# Patient Record
Sex: Female | Born: 1945 | Race: Black or African American | Hispanic: No | Marital: Married | Smoking: Former smoker
Health system: Southern US, Community
[De-identification: ages and names within clinical notes are randomized; demographics above are authoritative.]

## PROBLEM LIST (undated history)

## (undated) DIAGNOSIS — E7800 Pure hypercholesterolemia, unspecified: Secondary | ICD-10-CM

## (undated) DIAGNOSIS — R627 Adult failure to thrive: Secondary | ICD-10-CM

## (undated) DIAGNOSIS — K760 Fatty (change of) liver, not elsewhere classified: Secondary | ICD-10-CM

## (undated) DIAGNOSIS — K7010 Alcoholic hepatitis without ascites: Secondary | ICD-10-CM

## (undated) DIAGNOSIS — Z720 Tobacco use: Secondary | ICD-10-CM

## (undated) DIAGNOSIS — IMO0001 Reserved for inherently not codable concepts without codable children: Secondary | ICD-10-CM

## (undated) DIAGNOSIS — D649 Anemia, unspecified: Secondary | ICD-10-CM

## (undated) DIAGNOSIS — K8020 Calculus of gallbladder without cholecystitis without obstruction: Secondary | ICD-10-CM

## (undated) DIAGNOSIS — F1020 Alcohol dependence, uncomplicated: Secondary | ICD-10-CM

## (undated) DIAGNOSIS — G312 Degeneration of nervous system due to alcohol: Secondary | ICD-10-CM

## (undated) DIAGNOSIS — N390 Urinary tract infection, site not specified: Secondary | ICD-10-CM

## (undated) DIAGNOSIS — F1027 Alcohol dependence with alcohol-induced persisting dementia: Secondary | ICD-10-CM

## (undated) DIAGNOSIS — I10 Essential (primary) hypertension: Secondary | ICD-10-CM

---

## 2012-11-16 ENCOUNTER — Emergency Department (HOSPITAL_COMMUNITY): Payer: Self-pay

## 2012-11-16 ENCOUNTER — Inpatient Hospital Stay (HOSPITAL_COMMUNITY)
Admission: EM | Admit: 2012-11-16 | Discharge: 2012-11-17 | DRG: 896 | Disposition: A | Discharge status: Home / Self Care | Payer: MEDICAID | Attending: Internal Medicine | Admitting: Internal Medicine

## 2012-11-16 ENCOUNTER — Encounter (HOSPITAL_COMMUNITY): Payer: Self-pay

## 2012-11-16 DIAGNOSIS — K760 Fatty (change of) liver, not elsewhere classified: Secondary | ICD-10-CM | POA: Diagnosis present

## 2012-11-16 DIAGNOSIS — K8020 Calculus of gallbladder without cholecystitis without obstruction: Secondary | ICD-10-CM | POA: Diagnosis present

## 2012-11-16 DIAGNOSIS — D6959 Other secondary thrombocytopenia: Secondary | ICD-10-CM | POA: Diagnosis present

## 2012-11-16 DIAGNOSIS — N390 Urinary tract infection, site not specified: Secondary | ICD-10-CM | POA: Diagnosis present

## 2012-11-16 DIAGNOSIS — G312 Degeneration of nervous system due to alcohol: Secondary | ICD-10-CM

## 2012-11-16 DIAGNOSIS — F17200 Nicotine dependence, unspecified, uncomplicated: Secondary | ICD-10-CM | POA: Diagnosis present

## 2012-11-16 DIAGNOSIS — K7010 Alcoholic hepatitis without ascites: Secondary | ICD-10-CM | POA: Diagnosis present

## 2012-11-16 DIAGNOSIS — R5381 Other malaise: Secondary | ICD-10-CM | POA: Diagnosis present

## 2012-11-16 DIAGNOSIS — D649 Anemia, unspecified: Secondary | ICD-10-CM | POA: Diagnosis present

## 2012-11-16 DIAGNOSIS — F1020 Alcohol dependence, uncomplicated: Secondary | ICD-10-CM | POA: Diagnosis present

## 2012-11-16 DIAGNOSIS — R630 Anorexia: Secondary | ICD-10-CM | POA: Diagnosis present

## 2012-11-16 DIAGNOSIS — E43 Unspecified severe protein-calorie malnutrition: Secondary | ICD-10-CM | POA: Diagnosis present

## 2012-11-16 DIAGNOSIS — D696 Thrombocytopenia, unspecified: Secondary | ICD-10-CM | POA: Diagnosis present

## 2012-11-16 DIAGNOSIS — E871 Hypo-osmolality and hyponatremia: Secondary | ICD-10-CM | POA: Diagnosis present

## 2012-11-16 DIAGNOSIS — R634 Abnormal weight loss: Secondary | ICD-10-CM | POA: Diagnosis present

## 2012-11-16 DIAGNOSIS — IMO0002 Reserved for concepts with insufficient information to code with codable children: Secondary | ICD-10-CM

## 2012-11-16 DIAGNOSIS — R627 Adult failure to thrive: Secondary | ICD-10-CM | POA: Diagnosis present

## 2012-11-16 DIAGNOSIS — IMO0001 Reserved for inherently not codable concepts without codable children: Secondary | ICD-10-CM | POA: Diagnosis present

## 2012-11-16 DIAGNOSIS — F1027 Alcohol dependence with alcohol-induced persisting dementia: Principal | ICD-10-CM

## 2012-11-16 DIAGNOSIS — D72819 Decreased white blood cell count, unspecified: Secondary | ICD-10-CM | POA: Diagnosis not present

## 2012-11-16 HISTORY — DX: Calculus of gallbladder without cholecystitis without obstruction: K80.20

## 2012-11-16 HISTORY — DX: Alcohol dependence with alcohol-induced persisting dementia: F10.27

## 2012-11-16 HISTORY — DX: Alcohol dependence, uncomplicated: F10.20

## 2012-11-16 HISTORY — DX: Tobacco use: Z72.0

## 2012-11-16 HISTORY — DX: Degeneration of nervous system due to alcohol: G31.2

## 2012-11-16 HISTORY — DX: Reserved for inherently not codable concepts without codable children: IMO0001

## 2012-11-16 HISTORY — DX: Alcoholic hepatitis without ascites: K70.10

## 2012-11-16 HISTORY — DX: Fatty (change of) liver, not elsewhere classified: K76.0

## 2012-11-16 HISTORY — DX: Adult failure to thrive: R62.7

## 2012-11-16 HISTORY — DX: Urinary tract infection, site not specified: N39.0

## 2012-11-16 LAB — COMPREHENSIVE METABOLIC PANEL
ALT: 101 U/L — ABNORMAL HIGH (ref 0–35)
AST: 320 U/L — ABNORMAL HIGH (ref 0–37)
Albumin: 2.7 g/dL — ABNORMAL LOW (ref 3.5–5.2)
Alkaline Phosphatase: 168 U/L — ABNORMAL HIGH (ref 39–117)
Chloride: 86 mEq/L — ABNORMAL LOW (ref 96–112)
Potassium: 3.7 mEq/L (ref 3.5–5.1)
Sodium: 126 mEq/L — ABNORMAL LOW (ref 135–145)
Total Bilirubin: 2.5 mg/dL — ABNORMAL HIGH (ref 0.3–1.2)
Total Protein: 7.2 g/dL (ref 6.0–8.3)

## 2012-11-16 LAB — CBC WITH DIFFERENTIAL/PLATELET
Basophils Relative: 1 % (ref 0–1)
Eosinophils Absolute: 0 10*3/uL (ref 0.0–0.7)
Hemoglobin: 11 g/dL — ABNORMAL LOW (ref 12.0–15.0)
MCH: 32.8 pg (ref 26.0–34.0)
MCHC: 36.1 g/dL — ABNORMAL HIGH (ref 30.0–36.0)
Neutro Abs: 2.8 10*3/uL (ref 1.7–7.7)
Neutrophils Relative %: 62 % (ref 43–77)
Platelets: 111 10*3/uL — ABNORMAL LOW (ref 150–400)
RBC: 3.35 MIL/uL — ABNORMAL LOW (ref 3.87–5.11)

## 2012-11-16 LAB — AMMONIA: Ammonia: 37 umol/L (ref 11–60)

## 2012-11-16 MED ORDER — PANTOPRAZOLE SODIUM 40 MG PO TBEC
40.0000 mg | DELAYED_RELEASE_TABLET | Freq: Every day | ORAL | Status: DC
  Administered 2012-11-17: 40 mg via ORAL
  Filled 2012-11-16: qty 1

## 2012-11-16 MED ORDER — FOLIC ACID 1 MG PO TABS
1.0000 mg | ORAL_TABLET | Freq: Every day | ORAL | Status: DC
  Administered 2012-11-16 – 2012-11-17 (×2): 1 mg via ORAL
  Filled 2012-11-16 (×2): qty 1

## 2012-11-16 MED ORDER — DOCUSATE SODIUM 100 MG PO CAPS
100.0000 mg | ORAL_CAPSULE | Freq: Two times a day (BID) | ORAL | Status: DC
  Administered 2012-11-16 – 2012-11-17 (×2): 100 mg via ORAL
  Filled 2012-11-16 (×6): qty 1

## 2012-11-16 MED ORDER — ALUM & MAG HYDROXIDE-SIMETH 200-200-20 MG/5ML PO SUSP: 30.0000 mL | Freq: Four times a day (QID) | ORAL | Status: DC | PRN

## 2012-11-16 MED ORDER — ONDANSETRON HCL 4 MG PO TABS: 4.0000 mg | ORAL_TABLET | Freq: Four times a day (QID) | ORAL | Status: DC | PRN

## 2012-11-16 MED ORDER — THIAMINE HCL 100 MG/ML IJ SOLN
100.0000 mg | Freq: Every day | INTRAMUSCULAR | Status: DC
  Administered 2012-11-16: 100 mg via INTRAVENOUS
  Filled 2012-11-16: qty 2

## 2012-11-16 MED ORDER — ENSURE COMPLETE PO LIQD
237.0000 mL | Freq: Two times a day (BID) | ORAL | Status: DC
  Administered 2012-11-17: 237 mL via ORAL
  Filled 2012-11-16: qty 237

## 2012-11-16 MED ORDER — NICOTINE 21 MG/24HR TD PT24
21.0000 mg | MEDICATED_PATCH | Freq: Every day | TRANSDERMAL | Status: DC
  Administered 2012-11-17: 21 mg via TRANSDERMAL
  Filled 2012-11-16: qty 1

## 2012-11-16 MED ORDER — VITAMIN B-1 100 MG PO TABS
100.0000 mg | ORAL_TABLET | Freq: Every day | ORAL | Status: DC
  Administered 2012-11-17: 100 mg via ORAL
  Filled 2012-11-16: qty 1

## 2012-11-16 MED ORDER — LORAZEPAM 2 MG/ML IJ SOLN: 1.0000 mg | Freq: Four times a day (QID) | INTRAMUSCULAR | Status: DC | PRN

## 2012-11-16 MED ORDER — ALBUTEROL SULFATE (5 MG/ML) 0.5% IN NEBU: 2.5000 mg | INHALATION_SOLUTION | RESPIRATORY_TRACT | Status: DC | PRN

## 2012-11-16 MED ORDER — OXYCODONE HCL 5 MG PO TABS: 5.0000 mg | ORAL_TABLET | ORAL | Status: DC | PRN

## 2012-11-16 MED ORDER — THIAMINE HCL 100 MG/ML IJ SOLN
Freq: Once | INTRAVENOUS | Status: AC
  Administered 2012-11-16: 18:00:00 via INTRAVENOUS
  Filled 2012-11-16: qty 1000

## 2012-11-16 MED ORDER — PANTOPRAZOLE SODIUM 40 MG IV SOLR
40.0000 mg | Freq: Once | INTRAVENOUS | Status: AC
  Administered 2012-11-16: 40 mg via INTRAVENOUS
  Filled 2012-11-16: qty 40

## 2012-11-16 MED ORDER — GUAIFENESIN-DM 100-10 MG/5ML PO SYRP: 5.0000 mL | ORAL_SOLUTION | ORAL | Status: DC | PRN

## 2012-11-16 MED ORDER — ONDANSETRON HCL 4 MG/2ML IJ SOLN: 4.0000 mg | Freq: Four times a day (QID) | INTRAMUSCULAR | Status: DC | PRN

## 2012-11-16 MED ORDER — LORAZEPAM 1 MG PO TABS
1.0000 mg | ORAL_TABLET | Freq: Four times a day (QID) | ORAL | Status: DC | PRN
  Administered 2012-11-16: 1 mg via ORAL
  Filled 2012-11-16: qty 1

## 2012-11-16 MED ORDER — POTASSIUM CHLORIDE IN NACL 20-0.9 MEQ/L-% IV SOLN
INTRAVENOUS | Status: DC
  Administered 2012-11-16: 22:00:00 via INTRAVENOUS

## 2012-11-16 MED ORDER — ADULT MULTIVITAMIN W/MINERALS CH
1.0000 | ORAL_TABLET | Freq: Every day | ORAL | Status: DC
  Administered 2012-11-16 – 2012-11-17 (×2): 1 via ORAL
  Filled 2012-11-16 (×2): qty 1

## 2012-11-16 NOTE — ED Provider Notes (Signed)
History  This chart was scribed for Veryl Speak, MD by Roe Coombs, ED Scribe. The patient was seen in room APA15/APA15. Patient's care was started at 1417.  CSN: [id number]  Arrival date & time 11/16/12  1359   First MD Initiated Contact with Patient 11/16/12 1417      Chief Complaint  Patient presents with  . V70.1    The history is provided by the patient and a relative (daughter). No language interpreter was used.    HPI Comments: Sarah Parsons is a 67 y.o. female who presents to the Emergency Department complaining of weight loss, decreased appetite, decreased energy onset 2.5 months ago. Daughter says that on multiple occasions patient has reported that she is speaking and visiting with people who are already dead. Daughter states that patient has been drinking a significant amount of vodka and beer daily. She has also been smoking cigarettes, but daughter is not sure how often.  Patient lives with her husband, daughter states that he has noticed these changes as well. Daughter reports that before 2.5 months ago, patient was much more active and engaged. Daughter states that now she mostly lies in bed and only gets up to use the bathroom. Patient denies any pain, discomfort, shortness of breath or other problems. Patient has no known medical conditions. Patient does not have a PCP that she visits regularly.    Past Medical History  Diagnosis Date  . ETOH abuse     History reviewed. No pertinent past surgical history.  No family history on file.  History  Substance Use Topics  . Smoking status: Current Every Day Smoker  . Smokeless tobacco: Not on file  . Alcohol Use: Yes     Comment: daily    OB History    Grav Para Term Preterm Abortions TAB SAB Ect Mult Living                  Review of Systems  Constitutional: Positive for appetite change, fatigue and unexpected weight change.  HENT: Negative for congestion.   Eyes: Negative for visual disturbance.    Cardiovascular: Negative for chest pain.  Gastrointestinal: Negative for vomiting and abdominal pain.  Genitourinary: Negative for dysuria and hematuria.  Musculoskeletal: Negative for back pain.  Skin: Negative for rash.  Neurological: Negative for headaches.  Psychiatric/Behavioral: Negative for suicidal ideas.    Allergies  Review of patient's allergies indicates no known allergies.  Home Medications  No current outpatient prescriptions on file.  Triage Vitals: BP 142/89  Pulse 99  Temp 98.7 F (37.1 C) (Oral)  Resp 18  SpO2 97%  Physical Exam  Constitutional: She appears cachectic. No distress.       Thin and somewhat cachectic.  HENT:  Head: Normocephalic and atraumatic.  Mouth/Throat: Oropharynx is clear and moist.  Eyes: Conjunctivae normal and EOM are normal. Pupils are equal, round, and reactive to light.  Neck: Neck supple.  Cardiovascular: Normal rate and regular rhythm.   No murmur heard. Pulmonary/Chest: Effort normal and breath sounds normal. No respiratory distress. She has no wheezes. She has no rales.  Abdominal: Soft. Bowel sounds are normal. She exhibits no distension. There is no tenderness. There is no rebound and no guarding.  Musculoskeletal: Normal range of motion.  Neurological: She is alert.       Oriented to person and place, but not time.   Skin: Skin is warm and dry. No rash noted. She is not diaphoretic.    ED Course  Procedures (  including critical care time) DIAGNOSTIC STUDIES: Oxygen Saturation is 97% on room air, adequate by my interpretation.    COORDINATION OF CARE: 2:36 PM- Patient informed of current plan for treatment and evaluation and agrees with plan at this time. Daughter is bedside.     Labs Reviewed - No data to display No results found.   No diagnosis found.   Date: 11/16/2012  Rate: 106  Rhythm: sinus tachycardia  QRS Axis: normal  Intervals: normal  ST/T Wave abnormalities: nonspecific T wave changes   Conduction Disutrbances:none  Narrative Interpretation:   Old EKG Reviewed: none available    MDM  The patient presents here with weakness, confusion, weight loss for the past two months.  She is now hallucinating and getting weaker.  This appears to be alcohol-related as she drinks daily.  I have spoken with Dr. Caryn Section who agrees to admit the patient.        I personally performed the services described in this documentation, which was scribed in my presence. The recorded information has been reviewed and is accurate.      Veryl Speak, MD 11/16/12 [phone]

## 2012-11-16 NOTE — H&P (Signed)
[facility] History and Physical  Maka Neugebauer [id number] DOB: 13-Apr-1946 DOA: 11/16/2012  Referring physician: ED physician, Dr. Stark Jock PCP: No primary provider on file.  Specialists: None  Chief Complaint: Confusion, alcoholism, weakness.  HPI: Sarah Parsons is a 67 y.o. female with no known chronic medical illnesses other than alcoholism, who was brought in as an involuntary commitment for confusion, hallucinations, and alcoholism. The patient is alert, but does not know why she is in the hospital. The history is being provided by her daughter, [name]. Accordingly, [name] petitioned for involuntary commitment for her mother, the patient, who has been progressively confused over the past year. The patient has also not eaten well over the past 6 months. She has lost over 40 pounds in the last 2-3 months. She has had generalized weakness. She has had progressive confusion and intermittent hallucinations. Her memory has become progressively poor. She has been unable to ambulate without assistance. She has been unable to care for herself. The patient drinks a half a fifth to a fifth of vodka and beer everyday. She drinks with her husband who apparently is also an alcoholic. Yes, she is married and she does live in a home with her husband. He is not present. [name] is concerned about the patient's mental and physical deterioration over the past year. When I question the patient about her alcohol use, she says that she does not drink a lot. She has no complaints of chest pain, shortness of breath, difficulty chewing, difficulty swallowing, but she does acknowledge occasional abdominal pain. She denies pain with urination, diarrhea, bloody stools, and black tarry stools. She has had no recent falls.  In the emergency department, she is afebrile and hemodynamically stable. Her chest x-ray reveals no acute findings. Her EKG reveals sinus tachycardia, PACs, and nonspecific ST and T wave  abnormalities. Her lab data are significant for a normal troponin I., hemoglobin of 11.0, platelet count of 111, sodium of 126, total albumin of 2.5, AST of 320, ALT of 101, albumin of 2.7, and normal lipase. Her alcohol level is 105. She is being admitted for further evaluation and management.    Review of Systems: As above in the present illness. Her review of systems is also positive for occasional productive cough. Otherwise, review of systems is negative.  Past Medical History  Diagnosis Date  . Alcoholism /alcohol abuse   . Tobacco abuse    History reviewed. No pertinent past surgical history. Social History: The patient is married. She lives in [place] with her husband. She has 2 children. She has been drinking all of her adult life. She drinks a half a fifth to a fifth of vodka along with beer daily. She has not gone more than one day without drinking according to her daughter. She smokes a pack of cigarettes per day. She does not partake of illicit drugs. She ambulates by holding onto furniture and with the assistance of her husband.   No Known Allergies  Family history: Her parents are deceased, she does not recall what they died of. Her father however was an alcoholic.  Prior to Admission medications   Not on File   Physical Exam: Filed Vitals:   11/16/12 1407  BP: 142/89  Pulse: 99  Temp: 98.7 F (37.1 C)  TempSrc: Oral  Resp: 18  SpO2: 97%     General:  Small framed debilitated-appearing 67 year old African-American woman sitting up in bed, in no acute distress.  Eyes: Pupils are equal, round, and reactive to  light. Extraocular was are intact. Conjunctivae are clear. Sclerae are mildly icteric.  ENT: Tympanic membranes are obscured by cerumen bilaterally. Nasal mucosa is dry. Oropharynx reveals mildly dry mucous membranes. Multiple missing teeth and poor dentition. No exudates or erythema noted.  Neck: Supple, no adenopathy, no thyromegaly, no JVD.  Breasts:  No hard masses palpated.  Cardiovascular: S1, S2, with borderline tachycardia.  Respiratory: Breathing nonlabored. Lungs are clear to auscultation bilaterally.  Abdomen: Positive bowel sounds, soft, nontender, nondistended.  Skin: Fair turgor. No rashes.  Musculoskeletal: Peripheral muscle atrophy of her upper and lower extremities. No acute hot joints. No pedal edema. Pedal pulses are palpable bilaterally.  Psychiatric: She has a flat affect. Her speech is clear. She follows commands. She is cooperative.  Neurologic: Cranial nerves II through XII are grossly intact. Strength is globally 5 minus over 5 of the upper lower extremities symmetrically. Cerebellar with finger-to-nose appear to be intact without overshoot or intention tremor. Sensation is grossly intact. Gait not assessed. She is alert and oriented to herself, her daughter, hospital, and city. Overall, her memory is poor. She believes that she has been in the hospital for 2 weeks. She does not know the month or the year. She does not remember that she drank alcohol today. She says that she did not drink any alcohol today, but her breath smells of alcohol and her alcohol level is greater than 100.  Labs on Admission:  Basic Metabolic Panel:  Lab [id number] 1440  NA 126*  K 3.7  CL 86*  CO2 21  GLUCOSE 96  BUN 4*  CREATININE 0.57  CALCIUM 9.1  MG --  PHOS --   Liver Function Tests:  Lab 11/16/12 1440  AST 320*  ALT 101*  ALKPHOS 168*  BILITOT 2.5*  PROT 7.2  ALBUMIN 2.7*    Lab 11/16/12 1440  LIPASE 32  AMYLASE --    Lab 11/16/12 1524  AMMONIA 37   CBC:  Lab 11/16/12 1440  WBC 4.6  NEUTROABS 2.8  HGB 11.0*  HCT 30.5*  MCV 91.0  PLT 111*   Cardiac Enzymes:  Lab 11/16/12 1440  CKTOTAL --  CKMB --  CKMBINDEX --  TROPONINI <0.30    BNP (last 3 results) No results found for this basename: PROBNP:3 in the last 8760 hours CBG: No results found for this basename: GLUCAP:5 in the last 168  hours  Radiological Exams on Admission: Dg Chest 2 View  11/16/2012  *RADIOLOGY REPORT*  Clinical Data: Psychologic evaluation.  CHEST - 2 VIEW  Comparison: None  Findings: Heart and mediastinal contours are within normal limits. No focal opacities or effusions.  No acute bony abnormality.  IMPRESSION: No active cardiopulmonary disease.   Original Report Authenticated By: Rolm Baptise, M.D.     EKG: As above in history present illness.  Assessment/Plan Principal Problem:  *Alcoholic encephalopathy Active Problems:  Alcoholic dementia  FTT (failure to thrive) in adult  Alcoholism /alcohol abuse  Anorexia  Weight loss  Severe malnutrition  Hyponatremia  Thrombocytopenia  Alcoholic hepatitis   1. This is a 67 year old woman with obvious alcoholic encephalopathy and underlying alcoholic dementia that is failing to thrive at home. It is clear that she lacks capacity for informed consent. She is debilitated and confused, but not agitated. Her family is simply seeking help for her physical and mental deterioration. [name], her daughter, petitioned for involuntary commitment because the patient is obviously failing to thrive at home and is unable to care for herself. However, she  has been able bodied husband who lives with her. Her electrolyte abnormalities and abnormal laboratory results are likely chronic or subacute, and not unexpectedl in a chronic alcoholic.       Plan: 1. I informed her daughter [name], that the patient will be admitted to the hospital for IV fluid hydration, nutrition, and further evaluation of her confusion. However, I informed her that we could not force her to stop drinking unless she wanted to. [name] was informed that the patient does not have capacity for informed consent and that decisions regarding her disposition with the deferred to her family. At this point, I am uncertain if the patient would require inpatient psychiatric treatment or management. It is likely that  she will need 24 hour 7 day per week supervision at home or in a long-term care facility. 2. The patient was given IV fluids with multivitamins in the emergency department (banana bag). 3. We'll continue IV fluid hydration with normal saline with potassium chloride added. 4. We'll start the Ativan alcohol withdrawal protocol with daily multivitamin, thiamine, and folate. 5. We'll start Protonix empirically as it is likely that she may have an element of alcoholic gastritis along with alcoholic hepatitis. 6. We'll consult the social worker alcohol abuse and options regarding disposition. We'll consider ACT team consult if applicable. 7. We'll consult the physical therapist and occupational therapist. 8. We'll consult the registered dietitian to assist with the patient's poor nutritional health. We'll add in sure supplement twice a day. 9. For further evaluation, we'll order a TSH, RPR, and CT scan of the head. We'll order a viral hepatitis panel and ultrasound of her abdomen to assess for other causes of elevated LFTs aside from alcoholic hepatitis. 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  Code Status: Full code. Family Communication: Discussed with the daughter. Disposition Plan: To be determined.  Time spent: One hour and 20 minutes.  [facility] Pager [phone].  If 7PM-7AM, please contact night-coverage [email or web address] Password [facility] 11/16/2012, 6:28 PM

## 2012-11-16 NOTE — ED Notes (Signed)
Pt received via EMS secondary to c/o weakness, loss of appetite, loss of weight and seeing deceased relatives in her presence x 1 month. Pt lives at home with her spouse of 50+ years. Pt was ambulatory until about 2 months ago and daughter states pt's health has significantly deteriorated.  NAD noted at this time.

## 2012-11-16 NOTE — ED Notes (Signed)
Dr. Fischer at bedside.

## 2012-11-16 NOTE — ED Notes (Addendum)
EMS reports they were called because pt has had significant weight loss, etoh abuse, hallucinations, and talking to dead people.  EMS reports for " a while"  All pt does is get up to go to the bathroom and gets back in bed.  CBG 141.    Pt says has drank 1 beer today.  Pt denies any SI or HI.  Pt alert and oriented at this time.  [employer] came with pt with involuntary commitment papers.

## 2012-11-17 ENCOUNTER — Encounter (HOSPITAL_COMMUNITY): Payer: Self-pay | Admitting: Internal Medicine

## 2012-11-17 ENCOUNTER — Inpatient Hospital Stay (HOSPITAL_COMMUNITY): Payer: Self-pay

## 2012-11-17 DIAGNOSIS — D72819 Decreased white blood cell count, unspecified: Secondary | ICD-10-CM | POA: Diagnosis not present

## 2012-11-17 DIAGNOSIS — N390 Urinary tract infection, site not specified: Secondary | ICD-10-CM

## 2012-11-17 DIAGNOSIS — K8020 Calculus of gallbladder without cholecystitis without obstruction: Secondary | ICD-10-CM

## 2012-11-17 DIAGNOSIS — E41 Nutritional marasmus: Secondary | ICD-10-CM

## 2012-11-17 DIAGNOSIS — E871 Hypo-osmolality and hyponatremia: Secondary | ICD-10-CM

## 2012-11-17 DIAGNOSIS — K760 Fatty (change of) liver, not elsewhere classified: Secondary | ICD-10-CM

## 2012-11-17 HISTORY — DX: Fatty (change of) liver, not elsewhere classified: K76.0

## 2012-11-17 HISTORY — DX: Urinary tract infection, site not specified: N39.0

## 2012-11-17 HISTORY — DX: Calculus of gallbladder without cholecystitis without obstruction: K80.20

## 2012-11-17 LAB — BASIC METABOLIC PANEL
BUN: 4 mg/dL — ABNORMAL LOW (ref 6–23)
Chloride: 93 mEq/L — ABNORMAL LOW (ref 96–112)
Glucose, Bld: 95 mg/dL (ref 70–99)
Potassium: 3.7 mEq/L (ref 3.5–5.1)

## 2012-11-17 LAB — URINALYSIS, ROUTINE W REFLEX MICROSCOPIC
Nitrite: NEGATIVE
Protein, ur: NEGATIVE mg/dL
Urobilinogen, UA: 4 mg/dL — ABNORMAL HIGH (ref 0.0–1.0)

## 2012-11-17 LAB — CBC
HCT: 24.2 % — ABNORMAL LOW (ref 36.0–46.0)
MCHC: 36.4 g/dL — ABNORMAL HIGH (ref 30.0–36.0)
MCV: 90.6 fL (ref 78.0–100.0)
RDW: 15.6 % — ABNORMAL HIGH (ref 11.5–15.5)

## 2012-11-17 LAB — HEPATIC FUNCTION PANEL
ALT: 74 U/L — ABNORMAL HIGH (ref 0–35)
AST: 222 U/L — ABNORMAL HIGH (ref 0–37)
Bilirubin, Direct: 1.7 mg/dL — ABNORMAL HIGH (ref 0.0–0.3)
Total Bilirubin: 2.8 mg/dL — ABNORMAL HIGH (ref 0.3–1.2)

## 2012-11-17 LAB — HEMOGLOBIN A1C: Hgb A1c MFr Bld: 4.6 % (ref <5.7)

## 2012-11-17 LAB — HEPATITIS PANEL, ACUTE: Hep A IgM: NEGATIVE

## 2012-11-17 LAB — VITAMIN B12: Vitamin B-12: 1339 pg/mL — ABNORMAL HIGH (ref 211–911)

## 2012-11-17 LAB — URINE MICROSCOPIC-ADD ON

## 2012-11-17 LAB — RPR: RPR Ser Ql: NONREACTIVE

## 2012-11-17 LAB — TSH: TSH: 5.028 u[IU]/mL — ABNORMAL HIGH (ref 0.350–4.500)

## 2012-11-17 MED ORDER — MAGNESIUM SULFATE 40 MG/ML IJ SOLN
2.0000 g | Freq: Once | INTRAMUSCULAR | Status: AC
  Administered 2012-11-17: 2 g via INTRAVENOUS
  Filled 2012-11-17: qty 50

## 2012-11-17 MED ORDER — ENSURE COMPLETE PO LIQD: 237.0000 mL | Freq: Two times a day (BID) | ORAL | Status: DC

## 2012-11-17 MED ORDER — PANTOPRAZOLE SODIUM 40 MG PO TBEC: 40.0000 mg | DELAYED_RELEASE_TABLET | Freq: Every day | ORAL | Status: DC

## 2012-11-17 MED ORDER — CEFUROXIME AXETIL 250 MG PO TABS: 250.0000 mg | ORAL_TABLET | Freq: Two times a day (BID) | ORAL | Status: DC

## 2012-11-17 MED ORDER — THIAMINE HCL 100 MG PO TABS: 100.0000 mg | ORAL_TABLET | Freq: Every day | ORAL | Status: DC

## 2012-11-17 MED ORDER — DEXTROSE 5 % IV SOLN
1.0000 g | INTRAVENOUS | Status: DC
  Administered 2012-11-17: 1 g via INTRAVENOUS
  Filled 2012-11-17 (×2): qty 10

## 2012-11-17 MED ORDER — MAGNESIUM SULFATE 50 % IJ SOLN: 2.0000 g | Freq: Once | INTRAVENOUS | Status: DC

## 2012-11-17 MED ORDER — ADULT MULTIVITAMIN W/MINERALS CH: 1.0000 | ORAL_TABLET | Freq: Every day | ORAL | Status: DC

## 2012-11-17 NOTE — Discharge Summary (Signed)
Physician Discharge Summary  Sarah Parsons [id number] DOB: 1946-01-25 DOA: 11/16/2012  PCP: No primary provider on file.  Admit date: 11/16/2012 Discharge date: 11/17/2012  Time spent: Greater than 30 minutes  Recommendations for Outpatient Follow-up:  1. The patient's family was advised to apply for Medicaid and Medicare for the patient. They were advised to try to establish primary care at the [facility]. Sarah Parsons will call the patient's daughter, Sarah Parsons. 2. Ms. Luan Parsons, the patient's daughter, was advised to try to obtain healthcare power of attorney if the patient's husband agreed.  Discharge Diagnoses:   1. Alcoholic encephalopathy 2. Alcoholic dementia. 3. Alcohol abuse/alcoholism. 4. Failure to thrive. 5. Anorexia. 6. Severe malnutrition. 7. Weight loss. 8. Hyponatremia, likely secondary to alcoholism. 9. Thrombocytopenia, leukopenia, and anemia, all likely secondary to alcohol abuse. 10. Urinary tract infection. 11. Hypomagnesemia. 12. Tobacco abuse. The patient was advised to stop smoking. 63. Petition for involuntary commitment discontinued.  Discharge Condition: Stable, but overall chronically poor condition.  Diet recommendation: Diet as tolerated with nutritional supplements.  Filed Weights   11/17/12 1309  Weight: 59.4 kg (130 lb 15.3 oz)    History of present illness:   Sarah Parsons is a 67 y.o. female with no known chronic medical illnesses other than alcoholism, who was brought in as an involuntary commitment for confusion, hallucinations, and alcoholism. The patient was alert in the ED, but did not know why she was in the hospital. The history was provided by her daughter, [name]. Accordingly, Sarah Parsons petitioned for involuntary commitment for her mother, the patient, who had been progressively confused over the past year. The patient had also not eaten well over the past 6 months. She had lost over 40 pounds  in the last 2-3 months. She has had generalized weakness. She has had progressive confusion and intermittent hallucinations. Her memory has become progressively poor. She has been unable to ambulate without assistance. She has been unable to care for herself. The patient drinks a half a fifth to a fifth of vodka and beer everyday. She drinks with her husband who apparently is also an alcoholic.  He was not present. Sarah Parsons was concerned about the patient's mental and physical deterioration over the past year. When I questioned the patient about her alcohol use, she stated that she did not drink a lot. She had no complaints of chest pain, shortness of breath, difficulty chewing, difficulty swallowing, but she does acknowledge occasional abdominal pain. She denies pain with urination, diarrhea, bloody stools, and black tarry stools. She has had no recent falls.  In the emergency department, she was afebrile and hemodynamically stable. Her chest x-ray revealed no acute findings. Her EKG revealed sinus tachycardia, PACs, and nonspecific ST and T wave abnormalities. Her lab data were significant for a normal troponin I., hemoglobin of 11.0, platelet count of 111, sodium of 126, total albumin of 2.5, AST of 320, ALT of 101, albumin of 2.7, and normal lipase. Her alcohol level was 105. She was being admitted for further evaluation and management.     Hospital Course:   I informed the patient's daughter, Sarah Parsons, that the patient's clinical and physical deterioration was secondary to alcoholism and its detrimental toxic side effects. I informed her that the patient had alcoholic encephalopathy and alcoholic dementia. I informed her that the patient did not have capacity for informed consent and that decisions regarding her health care and well-being should be deferred to her family. The patient's husband was not initially present,  but came later. I told him all that I told Sarah Parsons. Because he provided alcohol for her, I told  him that he was enabling her to drink and that it was slowly killing her. I requested that he stop drinking so that he would be able to help her stop drinking. I advised Sarah Parsons to try to obtain healthcare power of attorney for the patient if her husband agreed.  A safety sitter was ordered as it was thought that the patient would likely experience alcohol withdrawal syndrome during the first 24 hours of the hospitalization. Fortunately she did not. The sitter was subsequently discontinued. The patient was started on IV fluids for hydration. She was also started on the Ativan alcohol withdrawal protocol with when necessary Ativan, multivitamin, and thiamine. Protonix was started empirically as she could have had an element of alcoholic gastritis along with alcoholic hepatitis. For further evaluation, a number of studies were ordered. Her troponin I. was negative x1. Her vitamin B12 was actually elevated at 1339. Her ammonia level was within normal limits at 37. Her RPR was nonreactive. Her TSH was mildly elevated at 5.0. Free T4 result was pending at the time of discharge. Her viral hepatitis panel was negative. CT scan of the head revealed atrophy and chronic microvascular ischemia, but no acute abnormality. Ultrasound of her abdomen revealed cholelithiasis without evidence of cholecystitis or biliary obstruction; it also revealed fatty infiltration of the liver. Her urinalysis revealed 11-20 WBCs and many bacteria. She was subsequently started on Rocephin.  Following IV fluid hydration, her serum sodium improved slightly to 127, but was still obviously low. Her WBC fell from 4.6-2.9, owing to the dilutional effects of IV fluids and bone marrow suppression from alcohol abuse. Her hemoglobin fell from 11.0-8.8, secondary to the dilutional effects of IV fluids and bone marrow suppression from alcohol abuse. Her platelet count fell to 111-91 secondary to the dilutional effects of IV fluids and bone marrow suppression  from alcohol abuse.  The registered dietitian was consulted to assist with the patient's obvious malnutrition and anorexia. Her recommendations were followed. The patient had no apparent structural reason for not eating other than alcoholism. There is no obvious abdominal mass or chest mass on exam or radiographically. She did not appear to have significant dysphagia as I actually said her a couple teaspoons of macaroni and cheese. The case manager presented the family with information that could assist the patient and family with community resources.  I encouraged the family to apply for Medicaid and then Medicare for the patient which she turns 78. They were encouraged to assist with establishing primary care for the patient at the health department and with a local primary doctor after she is eligible for Medicare. I supported Lisa's to try to help her mother, but I informed her there was little else we could do if she could not or would not stop drinking.  The patient remained confused, but was not tremulous or agitated during the hospitalization.  The patient was discharged to home on further antibiotic therapy for her urinary tract infection, PPI empirically, and vitamin therapy.   Procedures:  None  Consultations:  None  Discharge Exam: Filed Vitals:   11/17/12 0833 11/17/12 1309 11/17/12 1521 11/17/12 1525  BP:   130/63 137/87  Pulse:   113 80  Temp:   97.9 F (36.6 C) 97.5 F (36.4 C)  TempSrc:   Oral Oral  Resp:   16 18  Height:      Weight:  59.4 kg (130 lb 15.3 oz)    SpO2: 100%  100% 100%    General: debilitated 67 year old African-American woman sitting up in bed, alert, confused, but in no acute distress. Cardiovascular: S1, S2, with no murmurs rubs or gallops. Respiratory: clear to auscultation bilaterally. Abdomen: Positive bowel sounds, soft, nontender, nondistended. Neurologic: She is alert and oriented to herself, hospital, and daughter. She is oriented to  very little else. Cranial nerves II through XII are grossly intact.  Discharge Instructions  Discharge Orders    Future Orders Please Complete By Expires   Diet - low sodium heart healthy      Increase activity slowly      Discharge instructions      Comments:   Try to keep alcohol away Mrs. Hackmann. Give the patient 2-3 cans of Ensure daily if she does not eat. Try to establish primary care at the health department.       Medication List     As of 11/17/2012  7:46 PM    TAKE these medications         cefUROXime 250 MG tablet   Commonly known as: CEFTIN   Take 1 tablet (250 mg total) by mouth 2 (two) times daily. Antibiotic to be taken for 6 more days.      feeding supplement Liqd   Take 237 mLs by mouth 2 (two) times daily between meals.      multivitamin with minerals Tabs   Take 1 tablet by mouth daily.      pantoprazole 40 MG tablet   Commonly known as: PROTONIX   Take 1 tablet (40 mg total) by mouth daily. For treatment of gastritis or inflammation of the stomach lining.      thiamine 100 MG tablet   Take 1 tablet (100 mg total) by mouth daily.           Follow-up Information    Please follow up. (Followup at the [facility] to establish primary care.)           The results of significant diagnostics from this hospitalization (including imaging, microbiology, ancillary and laboratory) are listed below for reference.    Significant Diagnostic Studies: Dg Chest 2 View  11/16/2012  *RADIOLOGY REPORT*  Clinical Data: Psychologic evaluation.  CHEST - 2 VIEW  Comparison: None  Findings: Heart and mediastinal contours are within normal limits. No focal opacities or effusions.  No acute bony abnormality.  IMPRESSION: No active cardiopulmonary disease.   Original Report Authenticated By: Rolm Baptise, M.D.    Ct Head Wo Contrast  11/16/2012  *RADIOLOGY REPORT*  Clinical Data: Confusion  CT HEAD WITHOUT CONTRAST  Technique:  Contiguous  axial images were obtained from the base of the skull through the vertex without contrast.  Comparison: None  Findings: Generalized atrophy.  Negative for hydrocephalus.  Mild chronic microvascular ischemic changes in the white matter.  No acute infarct.  Negative for hemorrhage or mass.  Calvarium is intact.  IMPRESSION: Atrophy and chronic microvascular ischemia.  No acute abnormality.   Original Report Authenticated By: Carl Best, M.D.    US Abdomen Complete  11/17/2012  *RADIOLOGY REPORT*  Clinical Data:  Elevated LFTs  COMPLETE ABDOMINAL ULTRASOUND  Comparison:  None.  Findings:  Gallbladder:  Multiple sub centimeter layering stones.  No gallbladder wall thickening or pericholecystic fluid.  Sonographer reports no sonographic Murphy's sign.  Common bile duct:  3 mm diameter, unremarkable.  Liver:  Echogenic parenchyma, without focal lesion or intrahepatic  biliary ductal dilatation.  IVC:  Appears normal.  Pancreas:  No focal abnormality seen.  Spleen:  3.3 cm craniocaudal length, unremarkable.  Right Kidney:  10.6 cm. No hydronephrosis.  Well-preserved cortex. Normal size and parenchymal echotexture without focal abnormalities.  Left Kidney:  10 cm. No hydronephrosis.  Well-preserved cortex. Normal size and parenchymal echotexture without focal abnormalities.  Abdominal aorta:  Atheromatous, without aneurysm.  IMPRESSION:  1.  Cholelithiasis without other ultrasound evidence of cholecystitis or biliary obstruction. 2. Echogenic liver parenchyma, a nonspecific finding often associated with fatty infiltration.   Original Report Authenticated By: D. Wallace Going, MD     Microbiology: No results found for this or any previous visit (from the past 240 hour(s)).   Labs: Basic Metabolic Panel:  Lab [id number] 11/16/12 1440  NA 127* 126*  K 3.7 3.7  CL 93* 86*  CO2 24 21  GLUCOSE 95 96  BUN 4* 4*  CREATININE 0.60 0.57  CALCIUM 8.2* 9.1  MG -- 1.4*  PHOS -- 2.5   Liver Function Tests:  Lab  11/17/12 0454 11/16/12 1440  AST 222* 320*  ALT 74* 101*  ALKPHOS 133* 168*  BILITOT 2.8* 2.5*  PROT 5.9* 7.2  ALBUMIN 2.3* 2.7*    Lab 11/16/12 1440  LIPASE 32  AMYLASE --    Lab 11/16/12 1524  AMMONIA 37   CBC:  Lab 11/17/12 0930 11/16/12 1440  WBC 2.9* 4.6  NEUTROABS -- 2.8  HGB 8.8* 11.0*  HCT 24.2* 30.5*  MCV 90.6 91.0  PLT 91* 111*   Cardiac Enzymes:  Lab 11/16/12 1440  CKTOTAL --  CKMB --  CKMBINDEX --  TROPONINI <0.30   BNP: BNP (last 3 results) No results found for this basename: PROBNP:3 in the last 8760 hours CBG: No results found for this basename: GLUCAP:5 in the last 168 hours     Signed:  Princetta Uplinger  [facility] 11/17/2012, 7:46 PM

## 2012-11-17 NOTE — Clinical Social Work Psychosocial (Signed)
Clinical Social Work Department BRIEF PSYCHOSOCIAL ASSESSMENT 11/17/2012  Patient:  Sarah Parsons, Sarah Parsons     Account Number:  [id number]     Admit date:  11/16/2012  Clinical Social Worker:  Sarah Parsons  Date/Time:  11/17/2012 10:30 AM  Referred by:  Physician  Date Referred:  11/17/2012 Referred for  Substance Abuse   Other Referral:   Interview type:  Patient Other interview type:   and husband and daughter    PSYCHOSOCIAL DATA Living Status:  FAMILY Admitted from facility:   Level of care:   Primary support name:  Sarah Parsons Primary support relationship to patient:  SPOUSE Degree of support available:   adequate    CURRENT CONCERNS Current Concerns  Substance Abuse   Other Concerns:    SOCIAL WORK ASSESSMENT / PLAN CSW met with pt, pt's husband, and daughter at bedside. Pt lives with her husband. He reports she requires some assistance with ADLs as her gait is unstable. [name] lives nearby and checks in with parents regularly and provides transportation when needed. Pt does not have a PCP and takes no regular medications. Sarah Parsons, pt's daughter was very concerned about pt not eating and refusing to go see a doctor in addition to her heavy alcohol use. She took out IVC paperwork on her and brought her to ED. Pt is more clear this morning but answers with very short responses. She did not make eye contact while discussing ETOH use. Pt's husband reports she drinks about 1/2 pint of vodka daily. He admits to drinking as well, more than pt and acknowledges that sometimes when he drinks, she asks for some. CSW counseled pt and husband on negative health effects of drinking. Pt has long history back to childhood of alcohol use. She said she would cut back and is willing to go to [facility] for outpatient follow up but would not look at [facility] when discussing this. Pt's husband also said he would go as well. Sarah Parsons is willing to provide transportation but did not seem too confident that pt  would actually go. MD plans to rescind IVC and d/c pt today. Pt and family aware and agreeable. Pt is interested in applying for Medicaid and daughter would like to see pt have PCP. CM notified of this need as well as family's request for a rolling walker. Referrals given to [facility], DSS, and AA in addition to Rethinking Drinking and Older Adults and Alcohol booklets.   Assessment/plan status:  Referral to [employer] Other assessment/ plan:   Information/referral to community resources:   AA  [facility]  DSS  Rethinking Drinking booklet  Older Adults and Alcohol booklet    PATIENT'S/FAMILY'S RESPONSE TO PLAN OF CARE: Pt and family agreeable to d/c today with outpatient follow up. CM and MD updated on assessment.        Sarah Parsons, Sarah Parsons

## 2012-11-17 NOTE — Progress Notes (Signed)
INITIAL NUTRITION ASSESSMENT  DOCUMENTATION CODES Per approved criteria  -Severe  malnutrition in the context of social or environmental circumstances   INTERVENTION:  Ensure Complete po TID, each supplement provides 350 kcal and 13 grams of protein. Recommend continue Ensure at home and MVI daily.  Provided pt daughter with coupons for Ensure Supplement and  RD contact number.  Recommend if feasible pt follow up with outpatient RD for monitoring of nutrition status and support.  NUTRITION DIAGNOSIS: Inadequate oral intake related to anorexia, alcholism as evidenced by Alcoholic  enphalopathy ,observed meal intake 10-15% severe wt loss of 40#, <30months   Goal: Pt to meet >/= 90% of their estimated nutrition needs; not met   Reason for Assessment: Consult due to unplanned wt loss and malnutrition screen  67 y.o. female  Admitting Dx: Alcoholic encephalopathy  ASSESSMENT: Pt has anorexia and severe wt loss of 40# in < 6 months. ETOH abuse and very poor nutrition intake. She does like the Ensure and has consumed 100% today. She is in process being d/c back home. Encouraged daughter and pt to continue with oral supplement after discharge and provided coupons to assist with expense. Ideally she would benefit from additional nutrition follow up given her severely malnourished state. Question whether her nutrition status will improve until she is ready to alter her daily alcohol intake.  Pt meets criteria for Severe MALNUTRITION in the context of social or environmental circustances as evidenced by severe wt loss of >10% in < 6 months, severe fat loss (hollow look, dark circles, loose skin below the eye) and generalized weakness .   Height: Ht Readings from Last 1 Encounters:  11/16/12 5\' 7"  (1.702 m)    Weight: Wt Readings from Last 1 Encounters:  No data found for Wt    Ideal Body Weight: 135# (61.3 kg)  % Ideal Body Weight: 97%  Wt Readings from Last 10 Encounters:  No data  found for Wt  *wt obtained 59.4 kg  Usual Body Weight: 170# per daughter  % Usual Body Weight: 76%  BMI:  There is no weight on file to calculate BMI.  Estimated Nutritional Needs: Kcal: 1600-1800  Protein: 65-83 gr Fluid: 1 ml/kcal  Skin: no issues noted  Diet Order: Dysphagia 3 thin liquids   EDUCATION NEEDS: -Education needs addressed   Intake/Output Summary (Last 24 hours) at 11/17/12 1228 Last data filed at 11/17/12 0619  Gross per 24 hour  Intake    240 ml  Output    350 ml  Net   -110 ml    Last BM: 11/11/12  Labs:   Lab 11/17/12 0454 11/16/12 1440  NA 127* 126*  K 3.7 3.7  CL 93* 86*  CO2 24 21  BUN 4* 4*  CREATININE 0.60 0.57  CALCIUM 8.2* 9.1  MG -- 1.4*  PHOS -- 2.5  GLUCOSE 95 96    CBG (last 3)  No results found for this basename: GLUCAP:3 in the last 72 hours  Scheduled Meds:   . cefTRIAXone (ROCEPHIN)  IV  1 g Intravenous Q24H  . docusate sodium  100 mg Oral BID  . feeding supplement  237 mL Oral BID BM  . folic acid  1 mg Oral Daily  . multivitamin with minerals  1 tablet Oral Daily  . nicotine  21 mg Transdermal Daily  . pantoprazole  40 mg Oral Daily  . thiamine  100 mg Oral Daily   Or  . thiamine  100 mg Intravenous Daily  Continuous Infusions:   . 0.9 % NaCl with KCl 20 mEq / L 75 mL/hr at 11/16/12 2138    Past Medical History  Diagnosis Date  . Alcoholism /alcohol abuse   . Tobacco abuse   . UTI (urinary tract infection) 11/17/2012  . Hypomagnesemia 11/17/2012    History reviewed. No pertinent past surgical history.  [id number]

## 2012-11-17 NOTE — Care Management Note (Signed)
    Page 1 of 1   11/17/2012     12:01:01 PM   CARE MANAGEMENT NOTE 11/17/2012  Patient:  Sarah Parsons, Sarah Parsons   Account Number:  [id number]  Date Initiated:  11/17/2012  Documentation initiated by:  Claretha Cooper  Subjective/Objective Assessment:   Pt admitted from home where she lives with spouse. Daughter [name] is assisting with care.     Action/Plan:   Home, will need rolling walker   Anticipated DC Date:  11/17/2012   Anticipated DC Plan:  HOME/SELF CARE  In-house referral  Financial Counselor         Choice offered to / List presented to:     DME arranged  Vassie Moselle      DME agency  [place].        Status of service:  Completed, signed off Medicare Important Message given?   (If response is "NO", the following Medicare IM given date fields will be blank) Date Medicare IM given:   Date Additional Medicare IM given:    Discharge Disposition:  HOME/SELF CARE  Per UR Regulation:    If discussed at Long Length of Stay Meetings, dates discussed:    Comments:  11/17/12 Claretha Cooper RN BSN CM Madelaine Etienne to call daughter. [facility] to discuss RW and cost. Daughter to check with additional community sources for used walker. [employer] given to daughter for Health Department and other services she could qualify for.

## 2012-11-17 NOTE — Progress Notes (Signed)
UR Chart Review Completed  

## 2012-11-17 NOTE — Evaluation (Signed)
Physical Therapy Evaluation Patient Details Name: Sarah Parsons MRN: [id number] DOB: 09-Feb-1946 Today's Date: 11/17/2012 Time: [id number] PT Time Calculation (min): 24 min  PT Assessment / Plan / Recommendation Clinical Impression  Pt very impulsive.  Pt is not safe without an assistive device at this time.  Able to ambulate with min guard assist w/ rolling walker.  Pt will benefit from therapy for standing balance activities and  strengthening    PT Assessment  Patient needs continued PT services    Follow Up Recommendations  Home health PT (pt has no insurance given HEP if pt unable to afford)               Equipment Recommendations  Rolling walker with 5" wheels    Recommendations for Other Services OT consult   Frequency Min 3X/week    Precautions / Restrictions Precautions Precautions: Fall          Mobility  Bed Mobility Bed Mobility: Supine to Sit;Sit to Supine Supine to Sit: 7: Independent Sit to Supine: 7: Independent Details for Bed Mobility Assistance: but impulsive does too quickly despite verbal cuing to slow down for safety Transfers Transfers: Sit to Stand Sit to Stand: 7: Independent Details for Transfer Assistance: does quickly and impulsive Ambulation/Gait Ambulation/Gait Assistance: 4: Min guard Ambulation Distance (Feet): 150 Feet Assistive device: Rolling walker Ambulation/Gait Assistance Details: ambulates quickly needs cuing not to run into wallls Gait Pattern: Decreased step length - right;Decreased step length - left Gait velocity: quick           PT Diagnosis: Difficulty walking;Generalized weakness  PT Problem List: Decreased balance;Decreased activity tolerance PT Treatment Interventions: Gait training;Therapeutic exercise;Balance training   PT Goals Acute Rehab PT Goals PT Goal Formulation: With patient/family Time For Goal Achievement: 11/19/12 Potential to Achieve Goals: Good Pt will go Sit to Supine/Side: with  supervision PT Goal: Sit to Supine/Side - Progress: Goal set today Pt will go Stand to Sit: with supervision PT Goal: Stand to Sit - Progress: Goal set today Pt will Ambulate: >150 feet;with supervision;with rolling walker PT Goal: Ambulate - Progress: Goal set today Pt will Perform Home Exercise Program: with supervision, verbal cues required/provided PT Goal: Perform Home Exercise Program - Progress: Goal set today  Visit Information  Last PT Received On: 11/17/12    Subjective Data  Subjective: Pt daughter states pt lives with husband; no stairs at home. Patient Stated Goal: To go home   Prior [place] Lives With: Spouse Available Help at Discharge: Family Type of Home: House Home Access: Level entry Home Layout: One level Bathroom Shower/Tub: Chiropodist: Standard Home Adaptive Equipment: None Prior Function Level of Independence: Independent Able to Take Stairs?: No Vocation: Unemployed Communication Communication: No difficulties    Cognition  Overall Cognitive Status: Impaired Area of Impairment: Attention;Following commands;Safety/judgement;Executive functioning Arousal/Alertness: Awake/alert Orientation Level: Appears intact for tasks assessed Behavior During Session: [facility] for tasks performed Current Attention Level: Focused Following Commands: Follows one step commands inconsistently Safety/Judgement: Impulsive    Extremity/Trunk Assessment Right Lower Extremity Assessment RLE ROM/Strength/Tone: Deficits RLE ROM/Strength/Tone Deficits: decreased strength but functional Left Lower Extremity Assessment LLE ROM/Strength/Tone: Deficits LLE ROM/Strength/Tone Deficits: decreased strength but functional   Balance    End of Session PT - End of Session Equipment Utilized During Treatment: Gait belt Activity Tolerance: Patient tolerated treatment well Patient left: in chair;with call bell/phone within reach;with family/visitor  present Nurse Communication: Mobility status  GP     RUSSELL,CINDY 11/17/2012, 12:05 PM

## 2012-11-18 LAB — URINE CULTURE

## 2013-07-03 ENCOUNTER — Encounter (HOSPITAL_COMMUNITY): Payer: Self-pay | Admitting: *Deleted

## 2013-07-03 ENCOUNTER — Emergency Department (HOSPITAL_COMMUNITY)
Admission: EM | Admit: 2013-07-03 | Discharge: 2013-07-03 | Disposition: A | Discharge status: Home / Self Care | Payer: Medicare Other | Source: Home / Self Care | Attending: Emergency Medicine | Admitting: Emergency Medicine

## 2013-07-03 DIAGNOSIS — K7010 Alcoholic hepatitis without ascites: Secondary | ICD-10-CM | POA: Insufficient documentation

## 2013-07-03 DIAGNOSIS — R443 Hallucinations, unspecified: Secondary | ICD-10-CM | POA: Insufficient documentation

## 2013-07-03 DIAGNOSIS — I10 Essential (primary) hypertension: Secondary | ICD-10-CM | POA: Insufficient documentation

## 2013-07-03 DIAGNOSIS — R634 Abnormal weight loss: Secondary | ICD-10-CM | POA: Insufficient documentation

## 2013-07-03 DIAGNOSIS — F17200 Nicotine dependence, unspecified, uncomplicated: Secondary | ICD-10-CM | POA: Insufficient documentation

## 2013-07-03 DIAGNOSIS — E876 Hypokalemia: Secondary | ICD-10-CM | POA: Insufficient documentation

## 2013-07-03 DIAGNOSIS — Z862 Personal history of diseases of the blood and blood-forming organs and certain disorders involving the immune mechanism: Secondary | ICD-10-CM | POA: Insufficient documentation

## 2013-07-03 DIAGNOSIS — Z79899 Other long term (current) drug therapy: Secondary | ICD-10-CM | POA: Insufficient documentation

## 2013-07-03 DIAGNOSIS — F1020 Alcohol dependence, uncomplicated: Secondary | ICD-10-CM

## 2013-07-03 DIAGNOSIS — Z87440 Personal history of urinary (tract) infections: Secondary | ICD-10-CM | POA: Insufficient documentation

## 2013-07-03 DIAGNOSIS — F0390 Unspecified dementia without behavioral disturbance: Secondary | ICD-10-CM | POA: Insufficient documentation

## 2013-07-03 DIAGNOSIS — R630 Anorexia: Secondary | ICD-10-CM | POA: Insufficient documentation

## 2013-07-03 LAB — CBC WITH DIFFERENTIAL/PLATELET
Basophils Absolute: 0 10*3/uL (ref 0.0–0.1)
Eosinophils Absolute: 0.1 10*3/uL (ref 0.0–0.7)
Eosinophils Relative: 1 % (ref 0–5)
HCT: 29.8 % — ABNORMAL LOW (ref 36.0–46.0)
Lymphs Abs: 1 10*3/uL (ref 0.7–4.0)
MCH: 32.3 pg (ref 26.0–34.0)
MCV: 95.2 fL (ref 78.0–100.0)
Monocytes Absolute: 0.6 10*3/uL (ref 0.1–1.0)
Platelets: 125 10*3/uL — ABNORMAL LOW (ref 150–400)
RDW: 14 % (ref 11.5–15.5)

## 2013-07-03 LAB — MAGNESIUM: Magnesium: 1.3 mg/dL — ABNORMAL LOW (ref 1.5–2.5)

## 2013-07-03 LAB — COMPREHENSIVE METABOLIC PANEL
ALT: 63 U/L — ABNORMAL HIGH (ref 0–35)
Calcium: 10.3 mg/dL (ref 8.4–10.5)
Creatinine, Ser: 0.58 mg/dL (ref 0.50–1.10)
GFR calc Af Amer: >90 mL/min (ref >90)
GFR calc non Af Amer: >90 mL/min (ref >90)
Glucose, Bld: 116 mg/dL — ABNORMAL HIGH (ref 70–99)
Sodium: 132 mEq/L — ABNORMAL LOW (ref 135–145)
Total Protein: 7.2 g/dL (ref 6.0–8.3)

## 2013-07-03 LAB — URINALYSIS, ROUTINE W REFLEX MICROSCOPIC
Hgb urine dipstick: NEGATIVE
Leukocytes, UA: NEGATIVE
Nitrite: NEGATIVE
Protein, ur: NEGATIVE mg/dL
Specific Gravity, Urine: 1.01 (ref 1.005–1.030)
Urobilinogen, UA: 2 mg/dL — ABNORMAL HIGH (ref 0.0–1.0)

## 2013-07-03 LAB — AMMONIA: Ammonia: 21 umol/L (ref 11–60)

## 2013-07-03 LAB — ETHANOL: Alcohol, Ethyl (B): <11 mg/dL (ref 0–11)

## 2013-07-03 MED ORDER — POTASSIUM CHLORIDE CRYS ER 20 MEQ PO TBCR
40.0000 meq | EXTENDED_RELEASE_TABLET | Freq: Once | ORAL | Status: AC
  Administered 2013-07-03: 40 meq via ORAL
  Filled 2013-07-03: qty 1

## 2013-07-03 MED ORDER — SODIUM CHLORIDE 0.9 % IV BOLUS (SEPSIS)
700.0000 mL | Freq: Once | INTRAVENOUS | Status: AC
  Administered 2013-07-03: 700 mL via INTRAVENOUS

## 2013-07-03 MED ORDER — SODIUM CHLORIDE 0.9 % IV SOLN
INTRAVENOUS | Status: DC
  Administered 2013-07-03: 22:00:00 via INTRAVENOUS

## 2013-07-03 MED ORDER — POTASSIUM CHLORIDE CRYS ER 20 MEQ PO TBCR: 20.0000 meq | EXTENDED_RELEASE_TABLET | Freq: Two times a day (BID) | ORAL | Status: DC

## 2013-07-03 MED ORDER — MAGNESIUM SULFATE 40 MG/ML IJ SOLN
2.0000 g | Freq: Once | INTRAMUSCULAR | Status: AC
  Administered 2013-07-03: 2 g via INTRAVENOUS
  Filled 2013-07-03: qty 50

## 2013-07-03 NOTE — ED Notes (Signed)
[name] upset that pt will not be admitted.  Reinforced with daughter that if the physician felt there was not medical need for admission, he wouldn't be doing to.  [name] stated that she spoke with a Education officer, museum and was told "to come up here and get her admitted" before placement in a SNF could be done.  Reinforced with daughter to speak with pt's PCP regarding assistance with placement.

## 2013-07-03 NOTE — ED Provider Notes (Signed)
Scribed for No att. providers found, the patient was seen in room APA03/APA03. This chart was scribed by Denice Bors, ED scribe. Patient's care was started at 1923  CSN: [id number]     Arrival date & time 07/03/13  1836 History   First MD Initiated Contact with Patient 07/03/13 1904     Chief Complaint  Patient presents with  . Fatigue   (Consider location/radiation/quality/duration/timing/severity/associated sxs/prior Treatment) The history is provided by the patient and a relative.  Level 5 caveat applies due to dementia. HPI Comments: Sarah Parsons is a 67 y.o. female who presents to the Emergency Department complaining of decreased appetite onset for several  months. Reports associated weight loss, decreased appetite, and fatigue. Denies associated diarrhea, nausea, and abdominal pain. Last BM 2 days ago. No PCP. Reports she smokes 1 pack cigarettes daily. Family reports she drinks everyday and the patient and her husband would share a fifth of vodka a day.  Daughter states the patient would rather drink than eat and when she eats she pretends to eat then spits out the food when she thinks her daughter isn't looking.   Per daughter she uses a wheel chair and does not try to use a walker. Reports her husband/drinking buddy had a stroke with left sided hemiparesis 5 days ago and he is in the hospital with hemiparesis and can no longer care for her. Reports pt has visual hallucinations people who have been dead for a long time for several months. Report pertinent PMHx alcoholic dementia, and alcoholic encephalopathy.   PCP none  Past Medical History  Diagnosis Date  . Alcoholism /alcohol abuse   . Tobacco abuse   . UTI (urinary tract infection) 11/17/2012  . Hypomagnesemia 11/17/2012  . Leukopenia 11/17/2012  . Cholelithiasis 11/17/2012  . Fatty liver 11/17/2012  . 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 FTT (failure to thrive) in adult 11/16/2012   . Alcoholic hepatitis Q000111Q   History reviewed. No pertinent past surgical history. No family history on file. History  Substance Use Topics  . Smoking status: Current Every Day Smoker  . Smokeless tobacco: Not on file  . Alcohol Use: Yes     Comment: daily  uses a wheelchair No alcohol for 5 days Temporarily living with her daughter  OB History   Grav Para Term Preterm Abortions TAB SAB Ect Mult Living                 Review of Systems A complete 10 system review of systems was obtained and all systems are negative except as noted in the HPI and PMH.     Allergies  Review of patient's allergies indicates no known allergies.  Home Medications   Current Outpatient Rx  Name  Route  Sig  Dispense  Refill  . thiamine 100 MG tablet   Oral   Take 1 tablet (100 mg total) by mouth daily.         . feeding supplement (ENSURE COMPLETE) LIQD   Oral   Take 237 mLs by mouth 2 (two) times daily between meals.         . Multiple Vitamin (MULTIVITAMIN WITH MINERALS) TABS   Oral   Take 1 tablet by mouth daily.         . potassium chloride SA (K-DUR,KLOR-CON) 20 MEQ tablet   Oral   Take 1 tablet (20 mEq total) by mouth 2 (two) times daily.   8 tablet   0  BP 125/85  Pulse 99  Temp(Src) 97.9 F (36.6 C) (Oral)  Resp 18  Wt 130 lb (58.968 kg)  BMI 20.36 kg/m2  SpO2 99%  Vital signs normal   Physical Exam  Nursing note and vitals reviewed. Constitutional: She is oriented to person, place, and time.  Non-toxic appearance. She does not appear ill. No distress.  Thin and underweight  HENT:  Head: Normocephalic and atraumatic.  Right Ear: External ear normal.  Left Ear: External ear normal.  Nose: Nose normal. No mucosal edema or rhinorrhea.  Mouth/Throat: Oropharynx is clear and moist and mucous membranes are normal. No dental abscesses or edematous.  Eyes: Conjunctivae and EOM are normal. Pupils are equal, round, and reactive to light.  Neck: Normal range  of motion and full passive range of motion without pain. Neck supple.  Cardiovascular: Normal rate, regular rhythm and normal heart sounds.  Exam reveals no gallop and no friction rub.   No murmur heard. Pulmonary/Chest: Effort normal and breath sounds normal. No respiratory distress. She has no wheezes. She has no rhonchi. She has no rales. She exhibits no tenderness and no crepitus.  Abdominal: Soft. Normal appearance and bowel sounds are normal. She exhibits no distension. There is no tenderness. There is no rebound and no guarding.  Musculoskeletal: Normal range of motion. She exhibits no edema and no tenderness.  Moves all extremities well.   Neurological: She is alert and oriented to person, place, and time. She has normal strength. No cranial nerve deficit.  No tremor, no pronator drift, no focal weakness, oriented to month, place, president, not year (states it is the 1900's)  Skin: Skin is warm, dry and intact. No rash noted. No erythema. No pallor.  Psychiatric: She has a normal mood and affect. Her speech is normal and behavior is normal. Her mood appears not anxious.    ED Course  Procedures (including critical care time) Medications  0.9 %  sodium chloride infusion ( Intravenous Stopped 07/03/13 2330)  sodium chloride 0.9 % bolus 700 mL (0 mLs Intravenous Stopped 07/03/13 2129)  potassium chloride SA (K-DUR,KLOR-CON) CR tablet 40 mEq (40 mEq Oral Given 07/03/13 2130)  magnesium sulfate IVPB 2 g 50 mL (0 g Intravenous Stopped 07/03/13 2248)   9:40 PM Lab results were explained to family and pt. Daughter wants pt admitted to the hospital for 3 days to get her placed in a nursing facility. Daughter was explained that there needs to be a medical reason for admission.   22:58 Dr Darrick Meigs agrees patient not eligible for admission and she would not be eligible for NH placement from the hospital also. She can talk to the hospital social worker tomorrow about getting into placement. Patient's  daughter states her PCP is Dr. Moshe Cipro. She's advised to have Dr. Moshe Cipro start seeing her mother and help her with placement.    Labs Review  Results for orders placed during the hospital encounter of 07/03/13  CBC WITH DIFFERENTIAL      Result Value Range   WBC 4.8  4.0 - 10.5 K/uL   RBC 3.13 (*) 3.87 - 5.11 MIL/uL   Hemoglobin 10.1 (*) 12.0 - 15.0 g/dL   HCT 29.8 (*) 36.0 - 46.0 %   MCV 95.2  78.0 - 100.0 fL   MCH 32.3  26.0 - 34.0 pg   MCHC 33.9  30.0 - 36.0 g/dL   RDW 14.0  11.5 - 15.5 %   Platelets 125 (*) 150 - 400 K/uL   Neutrophils  Relative % 66  43 - 77 %   Neutro Abs 3.2  1.7 - 7.7 K/uL   Lymphocytes Relative 21  12 - 46 %   Lymphs Abs 1.0  0.7 - 4.0 K/uL   Monocytes Relative 12  3 - 12 %   Monocytes Absolute 0.6  0.1 - 1.0 K/uL   Eosinophils Relative 1  0 - 5 %   Eosinophils Absolute 0.1  0.0 - 0.7 K/uL   Basophils Relative 1  0 - 1 %   Basophils Absolute 0.0  0.0 - 0.1 K/uL  COMPREHENSIVE METABOLIC PANEL      Result Value Range   Sodium 132 (*) 135 - 145 mEq/L   Potassium 3.0 (*) 3.5 - 5.1 mEq/L   Chloride 89 (*) 96 - 112 mEq/L   CO2 32  19 - 32 mEq/L   Glucose, Bld 116 (*) 70 - 99 mg/dL   BUN 6  6 - 23 mg/dL   Creatinine, Ser 0.58  0.50 - 1.10 mg/dL   Calcium 10.3  8.4 - 10.5 mg/dL   Total Protein 7.2  6.0 - 8.3 g/dL   Albumin 3.1 (*) 3.5 - 5.2 g/dL   AST 111 (*) 0 - 37 U/L   ALT 63 (*) 0 - 35 U/L   Alkaline Phosphatase 120 (*) 39 - 117 U/L   Total Bilirubin 1.3 (*) 0.3 - 1.2 mg/dL   GFR calc non Af Amer >90  >90 mL/min   GFR calc Af Amer >90  >90 mL/min  MAGNESIUM      Result Value Range   Magnesium 1.3 (*) 1.5 - 2.5 mg/dL  URINALYSIS, ROUTINE W REFLEX MICROSCOPIC      Result Value Range   Color, Urine YELLOW  YELLOW   APPearance CLEAR  CLEAR   Specific Gravity, Urine 1.010  1.005 - 1.030   pH 6.5  5.0 - 8.0   Glucose, UA NEGATIVE  NEGATIVE mg/dL   Hgb urine dipstick NEGATIVE  NEGATIVE   Bilirubin Urine NEGATIVE  NEGATIVE   Ketones, ur NEGATIVE   NEGATIVE mg/dL   Protein, ur NEGATIVE  NEGATIVE mg/dL   Urobilinogen, UA 2.0 (*) 0.0 - 1.0 mg/dL   Nitrite NEGATIVE  NEGATIVE   Leukocytes, UA NEGATIVE  NEGATIVE  ETHANOL      Result Value Range   Alcohol, Ethyl (B) <11  0 - 11 mg/dL  AMMONIA      Result Value Range   Ammonia 21  11 - 60 umol/L   Laboratory interpretation all normal except low magnesium consistent with her alcohol use, low potassium consistent with her poor appetite, mild elevation of her liver function tests consistent with her alcoholism.   MDM   1. Alcoholism /alcohol abuse   2. Hypokalemia   3. Alcoholic hepatitis      I personally performed the services described in this documentation, which was scribed in my presence. The recorded information has been reviewed and considered.  Rolland Porter, MD, FACEP   Janice Norrie, MD 07/04/13 [phone]

## 2013-07-03 NOTE — ED Notes (Signed)
Per family, pt has been refusing to get out of bed, eat or care for self.  States "she isn't interested in doing anything but drinking alcohol."  Family reports last drink was Tuesday.  Family does report pt has a history of dementia.  Pt calm and cooperative at present.

## 2013-07-03 NOTE — ED Notes (Signed)
Pt brought to er by family with c/o not eating or drinking, generalized weakness, family member with pt states "pt is a drinker and she would rather have alcohol than food". Pt c/o cold symptoms, problems with urination,

## 2013-07-05 ENCOUNTER — Inpatient Hospital Stay (HOSPITAL_COMMUNITY): Payer: Medicare Other

## 2013-07-05 ENCOUNTER — Encounter (HOSPITAL_COMMUNITY): Payer: Self-pay | Admitting: Internal Medicine

## 2013-07-05 ENCOUNTER — Inpatient Hospital Stay (HOSPITAL_COMMUNITY)
Admission: AD | Admit: 2013-07-05 | Discharge: 2013-07-08 | DRG: 641 | Disposition: A | Discharge status: Skilled Nursing Facility | Payer: Medicare Other | Source: Ambulatory Visit | Attending: Internal Medicine | Admitting: Internal Medicine

## 2013-07-05 DIAGNOSIS — R4182 Altered mental status, unspecified: Secondary | ICD-10-CM

## 2013-07-05 DIAGNOSIS — R627 Adult failure to thrive: Secondary | ICD-10-CM | POA: Diagnosis present

## 2013-07-05 DIAGNOSIS — K7010 Alcoholic hepatitis without ascites: Secondary | ICD-10-CM | POA: Diagnosis present

## 2013-07-05 DIAGNOSIS — F1021 Alcohol dependence, in remission: Secondary | ICD-10-CM | POA: Diagnosis present

## 2013-07-05 DIAGNOSIS — D649 Anemia, unspecified: Secondary | ICD-10-CM | POA: Diagnosis present

## 2013-07-05 DIAGNOSIS — Z681 Body mass index (BMI) 19 or less, adult: Secondary | ICD-10-CM

## 2013-07-05 DIAGNOSIS — E871 Hypo-osmolality and hyponatremia: Principal | ICD-10-CM | POA: Diagnosis present

## 2013-07-05 DIAGNOSIS — F1027 Alcohol dependence with alcohol-induced persisting dementia: Secondary | ICD-10-CM | POA: Diagnosis present

## 2013-07-05 DIAGNOSIS — Z87891 Personal history of nicotine dependence: Secondary | ICD-10-CM

## 2013-07-05 DIAGNOSIS — R7989 Other specified abnormal findings of blood chemistry: Secondary | ICD-10-CM | POA: Diagnosis present

## 2013-07-05 DIAGNOSIS — R634 Abnormal weight loss: Secondary | ICD-10-CM | POA: Diagnosis present

## 2013-07-05 DIAGNOSIS — E039 Hypothyroidism, unspecified: Secondary | ICD-10-CM | POA: Diagnosis present

## 2013-07-05 DIAGNOSIS — I10 Essential (primary) hypertension: Secondary | ICD-10-CM | POA: Diagnosis present

## 2013-07-05 DIAGNOSIS — E876 Hypokalemia: Secondary | ICD-10-CM | POA: Diagnosis present

## 2013-07-05 DIAGNOSIS — E46 Unspecified protein-calorie malnutrition: Secondary | ICD-10-CM | POA: Diagnosis present

## 2013-07-05 DIAGNOSIS — R269 Unspecified abnormalities of gait and mobility: Secondary | ICD-10-CM | POA: Diagnosis present

## 2013-07-05 HISTORY — DX: Anemia, unspecified: D64.9

## 2013-07-05 HISTORY — DX: Essential (primary) hypertension: I10

## 2013-07-05 LAB — COMPREHENSIVE METABOLIC PANEL
Alkaline Phosphatase: 113 U/L (ref 39–117)
BUN: 6 mg/dL (ref 6–23)
Creatinine, Ser: 0.59 mg/dL (ref 0.50–1.10)
GFR calc Af Amer: >90 mL/min (ref >90)
Glucose, Bld: 107 mg/dL — ABNORMAL HIGH (ref 70–99)
Potassium: 3.9 mEq/L (ref 3.5–5.1)
Total Bilirubin: 0.9 mg/dL (ref 0.3–1.2)
Total Protein: 6.6 g/dL (ref 6.0–8.3)

## 2013-07-05 LAB — CBC WITH DIFFERENTIAL/PLATELET
Basophils Absolute: 0 10*3/uL (ref 0.0–0.1)
HCT: 26.6 % — ABNORMAL LOW (ref 36.0–46.0)
Lymphocytes Relative: 17 % (ref 12–46)
Neutro Abs: 2.9 10*3/uL (ref 1.7–7.7)
Platelets: 122 10*3/uL — ABNORMAL LOW (ref 150–400)
RDW: 14.9 % (ref 11.5–15.5)
WBC: 4.4 10*3/uL (ref 4.0–10.5)

## 2013-07-05 LAB — AMMONIA: Ammonia: 19 umol/L (ref 11–60)

## 2013-07-05 MED ORDER — AMLODIPINE BESYLATE 5 MG PO TABS: 2.5000 mg | ORAL_TABLET | Freq: Every day | ORAL | Status: DC

## 2013-07-05 MED ORDER — VITAMIN B-1 100 MG PO TABS
100.0000 mg | ORAL_TABLET | Freq: Every day | ORAL | Status: DC
  Administered 2013-07-05 – 2013-07-08 (×4): 100 mg via ORAL
  Filled 2013-07-05 (×4): qty 1

## 2013-07-05 MED ORDER — POTASSIUM CHLORIDE CRYS ER 20 MEQ PO TBCR
20.0000 meq | EXTENDED_RELEASE_TABLET | Freq: Two times a day (BID) | ORAL | Status: DC
  Administered 2013-07-05 – 2013-07-08 (×6): 20 meq via ORAL
  Filled 2013-07-05 (×6): qty 1

## 2013-07-05 MED ORDER — SODIUM CHLORIDE 0.9 % IJ SOLN: 3.0000 mL | INTRAMUSCULAR | Status: DC | PRN

## 2013-07-05 MED ORDER — SODIUM CHLORIDE 0.9 % IV SOLN: 250.0000 mL | INTRAVENOUS | Status: DC | PRN

## 2013-07-05 MED ORDER — SODIUM CHLORIDE 0.9 % IV SOLN: INTRAVENOUS | Status: DC

## 2013-07-05 MED ORDER — ENSURE COMPLETE PO LIQD
237.0000 mL | Freq: Two times a day (BID) | ORAL | Status: DC
  Administered 2013-07-06 – 2013-07-08 (×4): 237 mL via ORAL

## 2013-07-05 MED ORDER — ADULT MULTIVITAMIN W/MINERALS CH
1.0000 | ORAL_TABLET | Freq: Every day | ORAL | Status: DC
  Administered 2013-07-05 – 2013-07-08 (×4): 1 via ORAL
  Filled 2013-07-05 (×4): qty 1

## 2013-07-05 MED ORDER — SODIUM CHLORIDE 0.9 % IJ SOLN
3.0000 mL | Freq: Two times a day (BID) | INTRAMUSCULAR | Status: DC
  Administered 2013-07-05 – 2013-07-07 (×3): 3 mL via INTRAVENOUS

## 2013-07-05 MED ORDER — ENOXAPARIN SODIUM 40 MG/0.4ML ~~LOC~~ SOLN
40.0000 mg | SUBCUTANEOUS | Status: DC
  Administered 2013-07-05 – 2013-07-07 (×3): 40 mg via SUBCUTANEOUS
  Filled 2013-07-05 (×3): qty 0.4

## 2013-07-05 NOTE — Plan of Care (Signed)
[name] [name]) indicated that pt's husband was discharged from [facility] after having a stroke 8/26.  Pt's husband was the caregiver of current pt Sarah Parsons).  Pt has been increasing in confusion overtime, but has gotten much worse over the past couple months.  After assessment, pt is weak and unable to ambulate - this is new within the past couple weeks.  According to daughter, pt was about 130lbs (58.96kgs) as of a couple months ago and is now 49.1kgs.  Pt's appetite has decreased causing her to not eat but small amounts.  Pt has not had a fever. Pt does have a hx of dementia, but family is concerned about the sudden changes within the past couple weeks.

## 2013-07-05 NOTE — H&P (Signed)
Sarah Parsons is an 67 y.o. female.   Chief Complaint: altered mental status HPI: 67 yo female with hx of alcohol abuse, dementia with c/o altered mental status, failure to thrive, weight loss.   Pt is more confused according to her daughter and axox1.  Pt has hallucinations,  Visual at times.  Pt has not been eating, and appears dehdyrated, and has had signficant weight loss.  Pt has lost about 20lbs + over the past 4 months.  Pt gait is unsteady, at this point in time she can not walk.  Pt will be admitted for w/up of altered ms.   Past Medical History  Diagnosis Date  . Alcoholism /alcohol abuse   . Tobacco abuse   . UTI (urinary tract infection) 11/17/2012  . Hypomagnesemia 11/17/2012  . Leukopenia 11/17/2012  . Cholelithiasis 11/17/2012  . Fatty liver 11/17/2012  . 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 FTT (failure to thrive) in adult 11/16/2012  . 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 Hypertension     No past surgical history on file. Pshx: none  No family history on file.  Fhx:  Unable to obtain due to dementia (daughter is uncertain of fhx)  Social History:  reports that she quit smoking 7 days ago. Her smoking use included Cigarettes. She has a 40 pack-year smoking history. She does not have any smokeless tobacco history on file. She reports that she does not drink alcohol or use illicit drugs.  Allergies: No Known Allergies  Medications Prior to Admission  Medication Sig Dispense Refill  . feeding supplement (ENSURE COMPLETE) LIQD Take 237 mLs by mouth 2 (two) times daily between meals.      . Multiple Vitamin (MULTIVITAMIN WITH MINERALS) TABS Take 1 tablet by mouth daily.      . potassium chloride SA (K-DUR,KLOR-CON) 20 MEQ tablet Take 1 tablet (20 mEq total) by mouth 2 (two) times daily.  8 tablet  0  . thiamine 100 MG tablet Take 1 tablet (100 mg total) by mouth daily.        Results for orders placed during the hospital encounter of  07/03/13 (from the past 48 hour(s))  CBC WITH DIFFERENTIAL     Status: Abnormal   Collection Time    07/03/13  7:49 PM      Result Value Range   WBC 4.8  4.0 - 10.5 K/uL   RBC 3.13 (*) 3.87 - 5.11 MIL/uL   Hemoglobin 10.1 (*) 12.0 - 15.0 g/dL   HCT 29.8 (*) 36.0 - 46.0 %   MCV 95.2  78.0 - 100.0 fL   MCH 32.3  26.0 - 34.0 pg   MCHC 33.9  30.0 - 36.0 g/dL   RDW 14.0  11.5 - 15.5 %   Platelets 125 (*) 150 - 400 K/uL   Neutrophils Relative % 66  43 - 77 %   Neutro Abs 3.2  1.7 - 7.7 K/uL   Lymphocytes Relative 21  12 - 46 %   Lymphs Abs 1.0  0.7 - 4.0 K/uL   Monocytes Relative 12  3 - 12 %   Monocytes Absolute 0.6  0.1 - 1.0 K/uL   Eosinophils Relative 1  0 - 5 %   Eosinophils Absolute 0.1  0.0 - 0.7 K/uL   Basophils Relative 1  0 - 1 %   Basophils Absolute 0.0  0.0 - 0.1 K/uL  COMPREHENSIVE METABOLIC PANEL     Status: Abnormal   Collection Time  07/03/13  7:49 PM      Result Value Range   Sodium 132 (*) 135 - 145 mEq/L   Potassium 3.0 (*) 3.5 - 5.1 mEq/L   Chloride 89 (*) 96 - 112 mEq/L   CO2 32  19 - 32 mEq/L   Glucose, Bld 116 (*) 70 - 99 mg/dL   BUN 6  6 - 23 mg/dL   Creatinine, Ser 0.58  0.50 - 1.10 mg/dL   Calcium 10.3  8.4 - 10.5 mg/dL   Total Protein 7.2  6.0 - 8.3 g/dL   Albumin 3.1 (*) 3.5 - 5.2 g/dL   AST 111 (*) 0 - 37 U/L   ALT 63 (*) 0 - 35 U/L   Alkaline Phosphatase 120 (*) 39 - 117 U/L   Total Bilirubin 1.3 (*) 0.3 - 1.2 mg/dL   GFR calc non Af Amer >90  >90 mL/min   GFR calc Af Amer >90  >90 mL/min   Comment: (NOTE)     The eGFR has been calculated using the CKD EPI equation.     This calculation has not been validated in all clinical situations.     eGFR's persistently <90 mL/min signify possible Chronic Kidney     Disease.  MAGNESIUM     Status: Abnormal   Collection Time    07/03/13  7:49 PM      Result Value Range   Magnesium 1.3 (*) 1.5 - 2.5 mg/dL  ETHANOL     Status: None   Collection Time    07/03/13  7:49 PM      Result Value Range    Alcohol, Ethyl (B) <11  0 - 11 mg/dL   Comment:            LOWEST DETECTABLE LIMIT FOR     SERUM ALCOHOL IS 11 mg/dL     FOR MEDICAL PURPOSES ONLY  AMMONIA     Status: None   Collection Time    07/03/13  7:49 PM      Result Value Range   Ammonia 21  11 - 60 umol/L  URINALYSIS, ROUTINE W REFLEX MICROSCOPIC     Status: Abnormal   Collection Time    07/03/13 10:30 PM      Result Value Range   Color, Urine YELLOW  YELLOW   APPearance CLEAR  CLEAR   Specific Gravity, Urine 1.010  1.005 - 1.030   pH 6.5  5.0 - 8.0   Glucose, UA NEGATIVE  NEGATIVE mg/dL   Hgb urine dipstick NEGATIVE  NEGATIVE   Bilirubin Urine NEGATIVE  NEGATIVE   Ketones, ur NEGATIVE  NEGATIVE mg/dL   Protein, ur NEGATIVE  NEGATIVE mg/dL   Urobilinogen, UA 2.0 (*) 0.0 - 1.0 mg/dL   Nitrite NEGATIVE  NEGATIVE   Leukocytes, UA NEGATIVE  NEGATIVE   Comment: MICROSCOPIC NOT DONE ON URINES WITH NEGATIVE PROTEIN, BLOOD, LEUKOCYTES, NITRITE, OR GLUCOSE <1000 mg/dL.   No results found.  ROS unable to obtain clearly due to dementia  Blood pressure 159/89, pulse 92, temperature 98.4 F (36.9 C), temperature source Oral, height 5\' 7"  (1.702 m), weight 108 lb 3.9 oz (49.1 kg), SpO2 100.00%. Physical Exam  Heent: anicteric , temporal wasting Mmdry, tongue midline Neck: no jvd, no bruit, no tm, no adenoapthy Heart: rrr s1, s2 Lung: ctab Abd: soft, nt, nd, +bs Ext: no c/c/e Skin: no rash Lymph: no cervical adenopathy Neuro: nonfocal  Assessment/Plan AMS: check CT brain, check tsh, esr, (prior rpr negative, prior b12 wnl),  check cbc, cmp, u/a Hypothyroidism: check tsh Hypokalemia: check cmp Abnormal lft: check ammonia level,   Weight loss: check cxr, consider CT scan abd/pelvis in light of anemia Anemia: heme occult stool x3 Protein calorie malnutrition:  Consider start on prostat 12mL po bid Hyperglycemia: prior hga1c wnl Gait instability: ? Due to hx of alcoholism, needs PT/OT to evalaute Tobacco use in  remission etoh use in remission  Sarah Parsons 07/05/2013, 6:07 PM

## 2013-07-06 ENCOUNTER — Encounter (HOSPITAL_COMMUNITY): Payer: Self-pay | Admitting: *Deleted

## 2013-07-06 ENCOUNTER — Inpatient Hospital Stay (HOSPITAL_COMMUNITY): Payer: Medicare Other

## 2013-07-06 LAB — COMPREHENSIVE METABOLIC PANEL
ALT: 53 U/L — ABNORMAL HIGH (ref 0–35)
AST: 85 U/L — ABNORMAL HIGH (ref 0–37)
Alkaline Phosphatase: 114 U/L (ref 39–117)
CO2: 28 mEq/L (ref 19–32)
Chloride: 98 mEq/L (ref 96–112)
GFR calc Af Amer: >90 mL/min (ref >90)
GFR calc non Af Amer: >90 mL/min (ref >90)
Glucose, Bld: 122 mg/dL — ABNORMAL HIGH (ref 70–99)
Potassium: 3.2 mEq/L — ABNORMAL LOW (ref 3.5–5.1)
Sodium: 136 mEq/L (ref 135–145)
Total Bilirubin: 1.1 mg/dL (ref 0.3–1.2)

## 2013-07-06 LAB — TSH: TSH: 3.115 u[IU]/mL (ref 0.350–4.500)

## 2013-07-06 LAB — IRON AND TIBC
Saturation Ratios: 30 % (ref 20–55)
UIBC: 96 ug/dL — ABNORMAL LOW (ref 125–400)

## 2013-07-06 LAB — FERRITIN: Ferritin: 1452 ng/mL — ABNORMAL HIGH (ref 10–291)

## 2013-07-06 LAB — CBC
Hemoglobin: 9.6 g/dL — ABNORMAL LOW (ref 12.0–15.0)
Platelets: 146 10*3/uL — ABNORMAL LOW (ref 150–400)
RBC: 2.98 MIL/uL — ABNORMAL LOW (ref 3.87–5.11)
WBC: 4.4 10*3/uL (ref 4.0–10.5)

## 2013-07-06 MED ORDER — AMLODIPINE BESYLATE 5 MG PO TABS
5.0000 mg | ORAL_TABLET | Freq: Every day | ORAL | Status: DC
  Administered 2013-07-06 – 2013-07-08 (×3): 5 mg via ORAL
  Filled 2013-07-06 (×3): qty 1

## 2013-07-06 MED ORDER — IOHEXOL 300 MG/ML  SOLN
100.0000 mL | Freq: Once | INTRAMUSCULAR | Status: AC | PRN
  Administered 2013-07-06: 100 mL via INTRAVENOUS

## 2013-07-06 MED ORDER — POTASSIUM CHLORIDE 10 MEQ/100ML IV SOLN
10.0000 meq | INTRAVENOUS | Status: AC
  Administered 2013-07-06 (×3): 10 meq via INTRAVENOUS
  Filled 2013-07-06: qty 200
  Filled 2013-07-06: qty 100

## 2013-07-06 MED ORDER — IOHEXOL 300 MG/ML  SOLN
50.0000 mL | INTRAMUSCULAR | Status: AC
  Administered 2013-07-06 (×2): 50 mL via ORAL

## 2013-07-06 MED ORDER — SODIUM CHLORIDE 0.9 % IV SOLN
INTRAVENOUS | Status: DC
  Administered 2013-07-06 – 2013-07-07 (×2): via INTRAVENOUS

## 2013-07-06 MED ORDER — PRO-STAT SUGAR FREE PO LIQD
30.0000 mL | Freq: Three times a day (TID) | ORAL | Status: DC
  Administered 2013-07-06 – 2013-07-08 (×5): 30 mL via ORAL
  Filled 2013-07-06 (×5): qty 30

## 2013-07-06 NOTE — Progress Notes (Signed)
INITIAL NUTRITION ASSESSMENT  INTERVENTION:  Ensure Complete po BID, each supplement provides 350 kcal and 13 grams of protein.  ProStat 30 ml BID (each 30 ml provides 100 kcal, 15 gr protein)  RD will follow for nutrition care  NUTRITION DIAGNOSIS: Malnutrition; ongoing   Goal: Pt to meet >/= 90% of their estimated nutrition needs   Monitor:  Po intake, labs and wt trends  Reason for Assessment: Malnutrition Screen = 3  67 y.o. female   ASSESSMENT: Patient Active Problem List   Diagnosis Date Noted  . UTI (urinary tract infection) 11/17/2012  . Hypomagnesemia 11/17/2012  . Leukopenia 11/17/2012  . Cholelithiasis 11/17/2012  . Fatty liver 11/17/2012  . Alcoholic encephalopathy [id number]  . Alcoholic dementia [id number]  . FTT (failure to thrive) in adult 11/16/2012  . Alcoholism /alcohol abuse 11/16/2012  . Anorexia 11/16/2012  . Weight loss 11/16/2012  . Severe malnutrition 11/16/2012  . Hyponatremia 11/16/2012  . Thrombocytopenia 11/16/2012  . Alcoholic hepatitis [id number]   Pt assessed by RD in January this year during hospitalization due to acholic encephalopathy. At that time she was also diagnosed with severe malnutrition based on her severe wt loss and nutrition focused exam findings. Her wt has continued to decrease an additional 18#, 14%. She is unable to provide meaningful hx. Based on daughter's reports her po intake has been poor.   She has been drinking prep for abdominal CT this afternoon.  Disposition: SNF ([facility]) at discharge   Nutrition Focused Physical Exam:  Subcutaneous Fat depletion:  Orbital Region: severe Upper Arm Region: mild Thoracic and Lumbar Region: n/a  Muscle wasting:  Temple Region: moderate Clavicle Bone Region: severe Clavicle and Acromion Bone Region: moderate Scapular Bone Region: n/a Dorsal Hand: severe Patellar Region: moderate Anterior Thigh Region: severe Posterior Calf Region: mild-moderate  Edema: none  noted   Height: Ht Readings from Last 1 Encounters:  07/05/13 5\' 7"  (1.702 m)    Weight: Wt Readings from Last 1 Encounters:  07/06/13 111 lb 12.4 oz (50.7 kg)    Ideal Body Weight: 135# (61.3 kg)  % Ideal Body Weight: 83%  Wt Readings from Last 10 Encounters:  07/06/13 111 lb 12.4 oz (50.7 kg)  07/03/13 130 lb (58.968 kg)  11/17/12 130 lb 15.3 oz (59.4 kg)    Usual Body Weight: 130# (59.4kg)   % Usual Body Weight: 86%  BMI:  Body mass index is 17.5 kg/(m^2).underweight  Estimated Nutritional Needs: Kcal: 1500-1785 Protein: 60-70 gr Fluid: >1500 ml/day  Skin: No issues noted  Diet Order: Sodium Restricted  EDUCATION NEEDS: -Education needs addressed  No intake or output data in the 24 hours ending 07/06/13 0858  Last BM:  07/05/13 rectal tube  Labs:   Recent Labs Lab 07/03/13 1949 07/05/13 1933  NA 132* 134*  K 3.0* 3.9  CL 89* 96  CO2 32 30  BUN 6 6  CREATININE 0.58 0.59  CALCIUM 10.3 9.6  MG 1.3* 1.6  GLUCOSE 116* 107*    CBG (last 3)  No results found for this basename: GLUCAP,  in the last 72 hours  Scheduled Meds: . amLODipine  5 mg Oral Daily  . enoxaparin (LOVENOX) injection  40 mg Subcutaneous Q24H  . feeding supplement  237 mL Oral BID BM  . multivitamin with minerals  1 tablet Oral Daily  . potassium chloride SA  20 mEq Oral BID  . sodium chloride  3 mL Intravenous Q12H  . thiamine  100 mg Oral Daily  Continuous Infusions: . sodium chloride      Past Medical History  Diagnosis Date  . Tobacco abuse   . UTI (urinary tract infection) 11/17/2012  . Hypomagnesemia 11/17/2012  . Leukopenia 11/17/2012  . Cholelithiasis 11/17/2012  . Fatty liver 11/17/2012  . 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 FTT (failure to thrive) in adult 11/16/2012  . Hypertension   . Alcoholism /alcohol abuse   . 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 Anemia   . Hypokalemia     History reviewed. No pertinent past surgical  history.  Colman Cater MS,RD,LDN,CSG Office: [phone] Pager: [phone]

## 2013-07-06 NOTE — Evaluation (Signed)
Physical Therapy Evaluation Patient Details Name: Sarah Parsons MRN: [id number] DOB: 29-Sep-1946 Today's Date: 07/06/2013 Time: [id number] PT Time Calculation (min): 29 min  PT Assessment / Plan / Recommendation History of Present Illness   Pt is a 67 yo female admitted to [facility] with alcohol abuse.  PT normally lives with husband who is now in the hospital with a CVA.  Pt has a wheelchair and rolling walker at home.   Clinical Impression  Pt has significant decreased strength and balance and will benefit from skilled PT to return to prior functional level of ambulating with a rolling walker I.    PT Assessment  Patient needs continued PT services    Follow Up Recommendations  SNF    Does the patient have the potential to tolerate intense rehabilitation    no  Barriers to Discharge  decreased family support      Equipment Recommendations    none   Recommendations for Other Services  OT  Frequency Min 3X/week    Precautions / Restrictions Precautions Precautions: Fall Restrictions Weight Bearing Restrictions: No   Pertinent Vitals/Pain 0/10      Mobility  Bed Mobility Bed Mobility: Supine to Sit Supine to Sit: 6: Modified independent (Device/Increase time) Transfers Transfers: Sit to Stand Sit to Stand: 4: Min guard Ambulation/Gait Ambulation/Gait Assistance: 3: Mod assist Ambulation Distance (Feet): 3 Feet Assistive device: Rolling walker Gait Pattern: Decreased step length - left;Decreased step length - right;Antalgic    Exercises General Exercises - Lower Extremity Ankle Circles/Pumps: Both;10 reps Quad Sets: Both;10 reps Heel Slides: Both;10 reps   PT Diagnosis: Difficulty walking;Generalized weakness  PT Problem List: Decreased activity tolerance;Decreased balance;Decreased strength PT Treatment Interventions: Gait training;Therapeutic activities;Therapeutic exercise     PT Goals(Current goals can be found in the care plan section) Acute Rehab PT  Goals PT Goal Formulation: With patient Time For Goal Achievement: 07/11/13 Potential to Achieve Goals: Fair  Visit Information  Last PT Received On: 07/06/13       Prior [place] expects to be discharged to:: Private residence Living Arrangements: Spouse/significant other Type of Home: House Home Access: Level entry [place]: One [place]: None Prior Function Level of Independence:  (Per daughter at ER pt normally uses wheelchair) Communication Communication: No difficulties    Cognition  Cognition Arousal/Alertness: Awake/alert Overall Cognitive Status: Within Functional Limits for tasks assessed    Extremity/Trunk Assessment Lower Extremity Assessment Lower Extremity Assessment: Generalized weakness   Balance Balance Balance Assessed: No  End of Session PT - End of Session Equipment Utilized During Treatment: Gait belt Activity Tolerance: Patient tolerated treatment well Patient left: in chair;with chair alarm set;with nursing/sitter in room (no call light tech is looking into)  GP     RUSSELL,CINDY 07/06/2013, 10:14 AM

## 2013-07-06 NOTE — Progress Notes (Signed)
Patient unable to swallow medications. Medications were crushed and put in applesauce. Patient still exhibited some difficulty.

## 2013-07-06 NOTE — Progress Notes (Signed)
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   Objective: Vital signs in last 24 hours: Temp:  [98.3 F (36.8 C)-98.4 F (36.9 C)] 98.4 F (36.9 C) (09/03 0553) Pulse Rate:  [90-92] 91 (09/03 0553) Resp:  [18-20] 18 (09/03 0553) BP: (146-165)/(76-111) 146/76 mmHg (09/03 0553) SpO2:  [99 %-100 %] 100 % (09/03 0553) Weight:  [108 lb 3.9 oz (49.1 kg)-111 lb 12.4 oz (50.7 kg)] 111 lb 12.4 oz (50.7 kg) (09/03 0553)  Intake/Output from previous day:   Intake/Output this shift:    Heent: anicteric, mm moist, + temporal wasting Neck: no jvd, no bruit, no tm,  Heart: rrr s1, s2 Lung: ctab Abd: soft, nt, nd, +bs Ext: no c/c/e Skin: no rash Lymph: no cervical adenopathy Neuro: cn2-12 intact, reflexes 2+ symmetric, diffuse with downgoing toes bilaterally. Motor 5/5 in all 4 ext, slight decrease in vibration Gait unsteady  Results for orders placed during the hospital encounter of 07/05/13 (from the past 24 hour(s))  COMPREHENSIVE METABOLIC PANEL     Status: Abnormal   Collection Time    07/05/13  7:33 PM      Result Value Range   Sodium 134 (*) 135 - 145 mEq/L   Potassium 3.9  3.5 - 5.1 mEq/L   Chloride 96  96 - 112 mEq/L   CO2 30  19 - 32 mEq/L   Glucose, Bld 107 (*) 70 - 99 mg/dL   BUN 6  6 - 23 mg/dL   Creatinine, Ser 0.59  0.50 - 1.10 mg/dL   Calcium 9.6  8.4 - 10.5 mg/dL   Total Protein 6.6  6.0 - 8.3 g/dL   Albumin 2.8 (*) 3.5 - 5.2 g/dL   AST 105 (*) 0 - 37 U/L   ALT 59 (*) 0 - 35 U/L   Alkaline Phosphatase 113  39 - 117 U/L   Total Bilirubin 0.9  0.3 - 1.2 mg/dL   GFR calc non Af Amer >90  >90 mL/min   GFR calc Af Amer  >90  >90 mL/min  CBC WITH DIFFERENTIAL     Status: Abnormal   Collection Time    07/05/13  7:33 PM      Result Value Range   WBC 4.4  4.0 - 10.5 K/uL   RBC 2.75 (*) 3.87 - 5.11 MIL/uL   Hemoglobin 9.0 (*) 12.0 - 15.0 g/dL   HCT 26.6 (*) 36.0 - 46.0 %   MCV 96.7  78.0 - 100.0 fL   MCH 32.7  26.0 - 34.0 pg   MCHC 33.8  30.0 - 36.0 g/dL   RDW 14.9  11.5 - 15.5 %   Platelets 122 (*) 150 - 400 K/uL   Neutrophils Relative % 67  43 - 77 %   Neutro Abs 2.9  1.7 - 7.7 K/uL   Lymphocytes Relative 17  12 - 46 %   Lymphs Abs 0.7  0.7 - 4.0 K/uL   Monocytes Relative 14 (*) 3 - 12 %   Monocytes Absolute 0.6  0.1 - 1.0 K/uL   Eosinophils Relative 1  0 - 5 %   Eosinophils Absolute 0.1  0.0 - 0.7 K/uL   Basophils Relative 1  0 -  1 %   Basophils Absolute 0.0  0.0 - 0.1 K/uL  MAGNESIUM     Status: None   Collection Time    07/05/13  7:33 PM      Result Value Range   Magnesium 1.6  1.5 - 2.5 mg/dL  AMMONIA     Status: None   Collection Time    07/05/13  7:34 PM      Result Value Range   Ammonia 19  11 - 60 umol/L    Studies/Results: Dg Chest 1 View  07/05/2013   *RADIOLOGY REPORT*  Clinical Data: Weight loss.  Ex-smoker.  Hepatitis.  CHEST - 1 VIEW  Comparison:  11/16/2012  Findings: Midline trachea.  Normal heart size and mediastinal contours. No pleural effusion or pneumothorax.  Mild biapical pleural thickening. Clear lungs.  IMPRESSION: No acute cardiopulmonary disease.   Original Report Authenticated By: Abigail Miyamoto, M.D.   Ct Head Wo Contrast  07/05/2013   *RADIOLOGY REPORT*  Clinical Data: Altered mental status.  CT HEAD WITHOUT CONTRAST  Technique:  Contiguous axial images were obtained from the base of the skull through the vertex without contrast.  Comparison: 11/16/2012.  Findings: No intracranial hemorrhage.  Small vessel disease type changes without CT evidence of large acute infarct.  Mild global atrophy without hydrocephalus.  No intracranial mass lesion detected on this unenhanced  exam.  Calcification internal carotid artery greater on the left.  Bubbly opacification in the right sphenoid sinus with minimal mucosal thickening left sphenoid sinus.  Visualized orbital structures unremarkable.  IMPRESSION: Small vessel disease type changes without CT evidence of large acute infarct.  Mild global atrophy without hydrocephalus.  Bubbly opacification in the right sphenoid sinus with minimal mucosal thickening left sphenoid sinus.   Original Report Authenticated By: Genia Del, M.D.    Scheduled Meds: . amLODipine  2.5 mg Oral Daily  . enoxaparin (LOVENOX) injection  40 mg Subcutaneous Q24H  . feeding supplement  237 mL Oral BID BM  . multivitamin with minerals  1 tablet Oral Daily  . potassium chloride SA  20 mEq Oral BID  . sodium chloride  3 mL Intravenous Q12H  . thiamine  100 mg Oral Daily   Continuous Infusions: . sodium chloride     PRN Meds:sodium chloride, sodium chloride  Assessment/Plan: AMS: improving,  Hyponatremia: cont ns iv Hypertension uncontrolled, Increase amlodipine to 5mg  poqday Anemia: check spep, upep, awaiting hemeoccult stool Hypoklaemia: resolved Abnormal lft: stable Weight loss: tsh pending, consider check CT scan abd/pelvis Protein calorie malnutrition: nutrition consult Gait instability: PT /OT to evaluate and tx,  Dispo: needs placement at SNF   LOS: 1 day   Jani Gravel

## 2013-07-06 NOTE — Clinical Social Work Placement (Signed)
Clinical Social Work Department CLINICAL SOCIAL WORK PLACEMENT NOTE 07/06/2013  Patient:  Sarah Parsons, Sarah Parsons  Account Number:  [id number] Admit date:  07/05/2013  Clinical Social Worker:  Benay Pike, LCSW  Date/time:  07/06/2013 01:55 PM  Clinical Social Work is seeking post-discharge placement for this patient at the following level of care:   [place]   (*CSW will update this form in Epic as items are completed)   07/06/2013  Patient/family provided with [facility] Department of Clinical Social Work's list of facilities offering this level of care within the geographic area requested by the patient (or if unable, by the patient's family).  07/06/2013  Patient/family informed of their freedom to choose among providers that offer the needed level of care, that participate in Medicare, Medicaid or managed care program needed by the patient, have an available bed and are willing to accept the patient.  07/06/2013  Patient/family informed of [facility]' ownership interest in [facility], as well as of the fact that they are under no obligation to receive care at this facility.  PASARR submitted to EDS on 07/06/2013 PASARR number received from EDS on 07/06/2013  FL2 transmitted to all facilities in geographic area requested by pt/family on  07/06/2013 FL2 transmitted to all facilities within larger geographic area on   Patient informed that his/her managed care company has contracts with or will negotiate with  certain facilities, including the following:     Patient/family informed of bed offers received:  07/06/2013 Patient chooses bed at [facility] Physician recommends and patient chooses bed at  [facility]  Patient to be transferred to  on   Patient to be transferred to facility by   The following physician request were entered in Epic:   Additional Comments:  Benay Pike, [place]

## 2013-07-06 NOTE — Clinical Social Work Psychosocial (Signed)
Clinical Social Work Department BRIEF PSYCHOSOCIAL ASSESSMENT 07/06/2013  Patient:  Sarah Parsons, Sarah Parsons     Account Number:  [id number]     Admit date:  07/05/2013  Clinical Social Worker:  Wyatt Haste  Date/Time:  07/06/2013 01:35 PM  Referred by:  Physician  Date Referred:  07/06/2013 Referred for  SNF Placement   Other Referral:   Interview type:  Family Other interview type:   Sarah Parsons- daughter    PSYCHOSOCIAL DATA Living Status:  HUSBAND Admitted from facility:   Level of care:   Primary support name:  Percy/Lisa Primary support relationship to patient:  FAMILY Degree of support available:   supportive    CURRENT CONCERNS Current Concerns  Post-Acute Placement   Other Concerns:    SOCIAL WORK ASSESSMENT / PLAN CSW spoke with pt's daughter Sarah Parsons as pt is confused. Pt was living at home with her husband until last week when husband was admitted to hospital. He d/c to [place] yesterday. Pt has been staying with Sarah Parsons since then. Family appears to be involved and supportive. Pt's husband was caretaker for pt as she requires assistance with some ADLs. Pt is able to feed herself. She ambulates a few feet at baseline and refuses to use a walker. Pt has become incontinent and wears diapers at home. She has had some difficulty swallowing. Awaiting speech evaluation. Pt evaluated by PT today and recommendation is for SNF. Sarah Parsons had been working on getting pt transferred to [facility] from home. She has discussed this with [facility] and plan is to transfer pt there at d/c. CSW confirmed with Debbie at [facility]. Lisa aware of Medicare coverage/criteria.    CSW asked Sarah Parsons about pt's alcohol use. She states her last use was last Tuesday. Pt and her husband would drink together about a pint of vodka a day. She would also drink 3-4 beers daily. Pt has diagnosis of alcoholic dementia.   Assessment/plan status:  Psychosocial Support/Ongoing Assessment of Needs Other assessment/ plan:    Information/referral to community resources:   [facility]    PATIENT'S/FAMILY'S RESPONSE TO PLAN OF CARE: Pt unable to discuss plan of care at this time. Family request for pt to go to [facility] at d/c. Facility accepts pt. CSW will continue to follow.       Benay Pike, [place]

## 2013-07-06 NOTE — Progress Notes (Signed)
ST unable to see patient until Thursday.  Pt incontinent at times and have 24 hour urine in process.  RN called Dr. Maudie Mercury.  Dr. Maudie Mercury stated that it was ok for the patient to see ST on Thursday.  Dr. Maudie Mercury made aware of patient being incontinent at times and 24 hour.  He stated to "do our best" to obtain the specimen.  Patient also had order for NS @75  with thiamine and B1 added that had not been started.  Pt had been receiving NS @ 75 ml/hr.  Dr. Maudie Mercury notified.  Stated that this was ok.  Discontinue the 1 time bag of NS with thiamine and B1.  Orders followed.

## 2013-07-06 NOTE — Progress Notes (Signed)
UR chart review completed.  

## 2013-07-06 NOTE — Care Management Note (Addendum)
    Page 1 of 1   07/08/2013     11:07:39 AM   CARE MANAGEMENT NOTE 07/08/2013  Patient:  Sarah Parsons, Sarah Parsons   Account Number:  [id number]  Date Initiated:  07/06/2013  Documentation initiated by:  Theophilus Kinds  Subjective/Objective Assessment:   Pt admitted from home with altered mental status and failure to thrive. Pt was living at home with husband but required mod-max assistance with ADL's. Family is interested in placement at discharge.     Action/Plan:   CSW is aware of pts need. PT has recommended SNF at discharge. CSW will arrange discharge to facility at discharge.   Anticipated DC Date:  07/08/2013   Anticipated DC Plan:  SKILLED NURSING FACILITY  In-house referral  Clinical Social Worker      DC Planning Services  CM consult      Choice offered to / List presented to:             Status of service:  Completed, signed off Medicare Important Message given?  YES (If response is "NO", the following Medicare IM given date fields will be blank) Date Medicare IM given:  07/08/2013 Date Additional Medicare IM given:    Discharge Disposition:  [facility]  Per UR Regulation:    If discussed at Long Length of Stay Meetings, dates discussed:    Comments:  07/08/13 [place], RN BSN CM Pt discharged to [facility] today. CSw to arrange discharge to facility.  07/06/13 [place], RN BSN CM

## 2013-07-07 DIAGNOSIS — R4182 Altered mental status, unspecified: Secondary | ICD-10-CM

## 2013-07-07 DIAGNOSIS — D649 Anemia, unspecified: Secondary | ICD-10-CM

## 2013-07-07 DIAGNOSIS — E876 Hypokalemia: Secondary | ICD-10-CM | POA: Diagnosis present

## 2013-07-07 LAB — URINALYSIS, ROUTINE W REFLEX MICROSCOPIC
Nitrite: NEGATIVE
Specific Gravity, Urine: 1.01 (ref 1.005–1.030)
Urobilinogen, UA: 1 mg/dL (ref 0.0–1.0)

## 2013-07-07 LAB — BASIC METABOLIC PANEL
CO2: 27 mEq/L (ref 19–32)
Chloride: 105 mEq/L (ref 96–112)
Creatinine, Ser: 0.59 mg/dL (ref 0.50–1.10)
Glucose, Bld: 84 mg/dL (ref 70–99)

## 2013-07-07 LAB — URINE MICROSCOPIC-ADD ON

## 2013-07-07 MED ORDER — AMLODIPINE BESYLATE 5 MG PO TABS: 5.0000 mg | ORAL_TABLET | Freq: Every day | ORAL | Status: DC

## 2013-07-07 MED ORDER — PRO-STAT SUGAR FREE PO LIQD: 30.0000 mL | Freq: Three times a day (TID) | ORAL | Status: DC

## 2013-07-07 NOTE — Evaluation (Signed)
Clinical/Bedside Swallow Evaluation  Patient Details  Name: Sarah Parsons MRN: [id number] Date of Birth: [birth year]  Today's Date: 07/07/2013 Time: 0950-1017 SLP Time Calculation (min): 27 min  Past Medical History:  Past Medical History  Diagnosis Date  . Tobacco abuse   . UTI (urinary tract infection) 11/17/2012  . Hypomagnesemia 11/17/2012  . Leukopenia 11/17/2012  . Cholelithiasis 11/17/2012  . Fatty liver 11/17/2012  . 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 FTT (failure to thrive) in adult 11/16/2012  . Hypertension   . Alcoholism /alcohol abuse   . 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 Anemia   . Hypokalemia    Past Surgical History: History reviewed. No pertinent past surgical history.  HPI:  Sarah Parsons is a 67 y.o. female who presents to the Emergency Department complaining of decreased appetite onset for several  months. Reports associated weight loss, decreased appetite, and fatigue. Denies associated diarrhea, nausea, and abdominal pain. Last BM 2 days ago. No PCP. Reports she smokes 1 pack cigarettes daily. Family reports she drinks everyday and the patient and her husband would share a fifth of vodka a day.  Daughter states the patient would rather drink than eat and when she eats she pretends to eat then spits out the food when she thinks her daughter isn't looking.    Assessment / Plan / Recommendation Clinical Impression  No overt signs/symptoms aspiration with consistencies and textures presented. Pt was able to self-feed a container of pudding and drink from a straw without incident. She required set up assist for regular texture presentations, mild lingual residuals of crushed pills along tongue which clear with liquid wash. Dentition is sparse, but pt manages mastication of mech soft textures well. Suspect weight loss is due to malnutrition and cognitive component related to ETOH intake. Recommend Mech soft and think liquids. No  further SLP follow up indicated.    Aspiration Risk  None    Diet Recommendation Dysphagia 3 (Mechanical Soft);Thin liquid   Liquid Administration via: Cup;Straw Medication Administration: Crushed with puree Supervision: Patient able to self feed Postural Changes and/or Swallow Maneuvers: Seated upright 90 degrees;Out of bed for meals;Upright 30-60 min after meal    Other  Recommendations Oral Care Recommendations: Oral care BID Other Recommendations: Clarify dietary restrictions   Follow Up Recommendations  None    Frequency and Duration          Swallow Study Prior Functional Status   Limited po intake at home with husband.     General Date of Onset: 07/05/13 HPI: Sarah Parsons is a 67 y.o. female who presents to the Emergency Department complaining of decreased appetite onset for several  months. Reports associated weight loss, decreased appetite, and fatigue. Denies associated diarrhea, nausea, and abdominal pain. Last BM 2 days ago. No PCP. Reports she smokes 1 pack cigarettes daily. Family reports she drinks everyday and the patient and her husband would share a fifth of vodka a day.  Daughter states the patient would rather drink than eat and when she eats she pretends to eat then spits out the food when she thinks her daughter isn't looking.  Type of Study: Bedside swallow evaluation Previous Swallow Assessment: none on record Diet Prior to this Study: Dysphagia 1 (puree);Thin liquids Temperature Spikes Noted: No Respiratory Status: Room air History of Recent Intubation: No Behavior/Cognition: Alert;Pleasant mood;Confused Oral Cavity - Dentition: Poor condition;Missing dentition Self-Feeding Abilities: Able to feed self;Needs set up Patient Positioning: Upright in chair Baseline Vocal Quality: Clear Volitional Cough:  Weak Volitional Swallow: Able to elicit    Oral/Motor/Sensory Function Overall Oral Motor/Sensory Function: Appears within functional limits for  tasks assessed   Ice Chips Ice chips: Within functional limits Presentation: Spoon   Thin Liquid Thin Liquid: Within functional limits Presentation: Cup;Self Fed;Straw    Nectar Thick Nectar Thick Liquid: Not tested   Honey Thick Honey Thick Liquid: Not tested   Puree Puree: Within functional limits Presentation: Spoon;Self Fed   Solid      Solid: Within functional limits Presentation: Spoon      Thank you,  Genene Churn, [place]   PORTER,DABNEY 07/07/2013,10:37 AM

## 2013-07-07 NOTE — Progress Notes (Signed)
Patient on 24 hour urine collection but is incontinent at least 50% of the time. Foley catheter needed for accurate 24 hour collection.

## 2013-07-07 NOTE — Discharge Summary (Signed)
Physician Discharge Summary  Patient ID: Sarah Parsons MRN: [id number] DOB/AGE: 1946-04-28 67 y.o.  Admit date: 07/05/2013 Discharge date: 07/08/2013  Admission Diagnoses: AMS Hypokalemia Hyponatremia Anemia Etoh dementia Unsteady gait Weight loss   Discharge Diagnoses:  AMS, resolved  Principal Problem:   Altered mental status Active Problems:   Alcoholic dementia   FTT (failure to thrive) in adult   Weight loss   Hyponatremia   Hypokalemia   Anemia   Discharged Condition: stable  Hospital Course: 67 yo female with hx of etoh abuse, tobacco abuse,  Alcoholic dementia, Hypokalemia, apparently appeared more confused according to her family in particular her daughter.  Pt has had failure to thrive and unsteady gait. Pt may have some dysphagia as well per family.  Pt has had significant >10lbs of weight loss recently.  Pt was brought to the hospital CT brain was negative, CXR negative, u/a negative.  TSH pending.  Pt found to be significantly anemic and hemeoccult is still pending, and spep, upep still pending.  Pt noted to be hypokalemic and hyponatremic, and her hyponatremia resolved with gentle hydration and her hypokalemia resolved with replacement.  Pt's bp was unctonrolled and pt was started on amlodipine 2.5mg  po qday and then titrated upwards to 5mg  poq day.  PT evaluated the patient for unsteady gait. Pt appears to have improved in terms of her mental status and is axox2.  Pt will be discharged to SNF today for rehab.   Consults: None  Significant Diagnostic Studies: labs: , CT scan abd/pelvis => no acute process  Treatments: IV hydration  Discharge Exam: Blood pressure 130/74, pulse 82, temperature 98.2 F (36.8 C), temperature source Oral, resp. rate 18, height 5\' 7"  (1.702 m), weight 108 lb 14.5 oz (49.4 kg), SpO2 100.00%. Heent: + temporal wasting, anicteric Neck: no jvd, no bruit, no tm,  Heart: rrr s1, s2 Lung: ctab Abd: soft, nt, nd, +bs Ext: no  c/c/e Skin: no rash, unable to see if palmar erythema Lymph:  No adenoapthy Neuro:  + decrease in vibrtatory sense   Disposition: SNF     Medication List         amLODipine 5 MG tablet  Commonly known as:  NORVASC  Take 1 tablet (5 mg total) by mouth daily.     feeding supplement Liqd  Take 30 mLs by mouth 3 (three) times daily with meals.     multivitamin with minerals Tabs tablet  Take 1 tablet by mouth daily.     potassium chloride SA 20 MEQ tablet  Commonly known as:  K-DUR,KLOR-CON  Take 1 tablet (20 mEq total) by mouth 2 (two) times daily.     thiamine 100 MG tablet  Take 1 tablet (100 mg total) by mouth daily.       Follow-up Information   Follow up with Jani Gravel, MD In 3 days. (at [facility])    Specialty:  Internal Medicine   Contact information:   [address] [place] [address] [phone]        A/P:  AMS: resolved Hyponatremia: resolved Hypokalemia: continue potassium , and recheck cmp in 1 week Anemia:  Stable,  Repeat cbc in 1 week,  F/u on spep/upep, hemeoccult stool Hypertension: please check vs qshift  x72 hours, and then qday,  Call if sbp>170 physician.  Alcoholic dementia: PT/OT to evaluate unsteady gait, and speech to evaluate for any dysphagia,  Pt needs pureed regular diet with thin liquids for now.  Protein calorie malnutrition:  Pt  needs prostate 20ml po bid Signed: Jani Gravel 07/08/2013, 8:10 AM

## 2013-07-07 NOTE — Progress Notes (Signed)
1045 - Dr. Maudie Mercury on unit to see patient.  Informed him that patient's 24-hour urine was in process but patient continues to be incontinent at times, as I had called him about yesterday.  Dr. Maudie Mercury stated to continue obtaining what we could for specimen.  Orders followed.

## 2013-07-07 NOTE — Progress Notes (Signed)
Physical Therapy Treatment Patient Details Name: Sarah Parsons MRN: [id number] DOB: [birth year] Today's Date: 07/07/2013 Time: 0850-0920 PT Time Calculation (min): 30 min  PT Assessment / Plan / Recommendation  History of Present Illness  Pt admitted with weakness   PT Comments   Pt shows improvement today with strength but still remains very debilitated.  Pt will continue to benefit from skilled therapy.  Follow Up Recommendations  SNF     Does the patient have the potential to tolerate intense rehabilitation   no  Barriers to Discharge  husband is now in SNF due to CVA      Equipment Recommendations    none   Recommendations for Other Services  OT  Frequency Min 3X/week   Progress towards PT Goals Progress towards PT goals: Progressing toward goals  Plan Current plan remains appropriate    Precautions / Restrictions Precautions Precautions: Fall Restrictions Weight Bearing Restrictions: No   Pertinent Vitals/Pain none    Mobility  Bed Mobility Supine to Sit: 6: Modified independent (Device/Increase time) Transfers Sit to Stand: 4: Min assist Ambulation/Gait Ambulation/Gait Assistance: 4: Min assist;3: Mod assist Ambulation Distance (Feet): 3 Feet Assistive device: Rolling walker Gait Pattern: Decreased step length - right;Decreased step length - left Gait velocity: slow    Exercises General Exercises - Lower Extremity Long Arc Quad: 10 reps;Both Heel Slides: Both;10 reps Hip ABduction/ADduction: Both;10 reps Straight Leg Raises: Both;10 reps Hip Flexion/Marching: Both;Standing;5 reps Heel Raises: Both;Standing;5 reps Mini-Sqauts: Both;5 reps   PT Diagnosis:   difficulty walking PT Problem List:  weakness PT Treatment Interventions:  there ex, there activity   PT Goals (current goals can now be found in the care plan section)    Visit Information  Last PT Received On: 07/07/13    Subjective Data   Pt states she is feeling a little better    Cognition  Cognition Arousal/Alertness: Awake/alert Overall Cognitive Status: Within Functional Limits for tasks assessed    Balance   NT  End of Session PT - End of Session Equipment Utilized During Treatment: Gait belt Activity Tolerance: Patient tolerated treatment well Patient left: in chair;with chair alarm set   GP     Branna Cortina,CINDY 07/07/2013, 9:25 AM

## 2013-07-08 LAB — PROTEIN ELECTROPHORESIS, SERUM
Alpha-1-Globulin: 4.3 % (ref 2.9–4.9)
Beta 2: 9.5 % — ABNORMAL HIGH (ref 3.2–6.5)
Gamma Globulin: 13.2 % (ref 11.1–18.8)
M-Spike, %: NOT DETECTED g/dL

## 2013-07-08 NOTE — Progress Notes (Signed)
Report called to [facility] and family getting patient ready for transport. IV cath removed. No pain/swelling at site.

## 2013-07-08 NOTE — Progress Notes (Signed)
Subjective: Altered mental status: pt is axox2 (person and place) today  Anemia: significant, heme occult stool still pending, iron studies pending , spep, upep pending Hypertension uncontrolled: Pt started on amlodipine 2.5mg  poqday and then increased to 5mg  and tolerating at the present time  Hyponatremia: Mild, Pt is receiving ns iv  Hypokalemia: resolved  Hypomagnesmia resolved  ETOH dep in remission  Tobacco dep in remission  Unsteady Gait:  Pt awaiting SNF placement     Objective: Vital signs in last 24 hours:  VSS, afebrile  Heent: anicteric, mm moist, + temporal wasting  Neck: no jvd, no bruit, no tm,  Heart: rrr s1, s2  Lung: ctab  Abd: soft, nt, nd, +bs  Ext: no c/c/e  Skin: no rash  Lymph: no cervical adenopathy  Neuro: cn2-12 intact, reflexes 2+ symmetric, diffuse with downgoing toes bilaterally. Motor 5/5 in all 4 ext, slight decrease in vibration  Gait unsteady     No results found for this or any previous visit (from the past 24 hour(s)).  Studies/Results: Dg Chest 1 View  07/05/2013   *RADIOLOGY REPORT*  Clinical Data: Weight loss.  Ex-smoker.  Hepatitis.  CHEST - 1 VIEW  Comparison:  11/16/2012  Findings: Midline trachea.  Normal heart size and mediastinal contours. No pleural effusion or pneumothorax.  Mild biapical pleural thickening. Clear lungs.  IMPRESSION: No acute cardiopulmonary disease.   Original Report Authenticated By: Abigail Miyamoto, M.D.   Ct Head Wo Contrast  07/05/2013   *RADIOLOGY REPORT*  Clinical Data: Altered mental status.  CT HEAD WITHOUT CONTRAST  Technique:  Contiguous axial images were obtained from the base of the skull through the vertex without contrast.  Comparison: 11/16/2012.  Findings: No intracranial hemorrhage.  Small vessel disease type changes without CT evidence of large acute infarct.  Mild global atrophy without hydrocephalus.  No intracranial mass lesion detected on this unenhanced exam.  Calcification internal carotid artery  greater on the left.  Bubbly opacification in the right sphenoid sinus with minimal mucosal thickening left sphenoid sinus.  Visualized orbital structures unremarkable.  IMPRESSION: Small vessel disease type changes without CT evidence of large acute infarct.  Mild global atrophy without hydrocephalus.  Bubbly opacification in the right sphenoid sinus with minimal mucosal thickening left sphenoid sinus.   Original Report Authenticated By: Genia Del, M.D.   Ct Abdomen Pelvis W Contrast  07/06/2013   *RADIOLOGY REPORT*  Clinical Data: History of cholelithiasis and weight loss  CT ABDOMEN AND PELVIS WITH CONTRAST  Technique:  Multidetector CT imaging of the abdomen and pelvis was performed following the standard protocol during bolus administration of intravenous contrast.  Contrast: 117mL OMNIPAQUE IOHEXOL 300 MG/ML  SOLN  Comparison: 11/17/2012  Findings: The lung bases demonstrate some mild atelectatic changes bilaterally.  Diffuse fatty infiltration of the liver is seen.  Multiple small gallstones are noted within the dependent portion of the gallbladder.  No obstructive changes are noted.  The spleen, adrenal glands, pancreas and kidneys are within normal limits.  The appendix is well visualized within normal limits.  Scattered diverticular change of the colon is noted.  No diverticulitis is seen.  Calcified uterine fibroids are seen.  The bladder is well distended with urine.  Atherosclerotic change of the aorta is noted without aneurysmal dilatation.  No acute bony abnormality is noted.  IMPRESSION: Stable cholelithiasis.  No acute abnormality is noted.   Original Report Authenticated By: Inez Catalina, M.D.    Scheduled Meds: . amLODipine  5 mg Oral Daily  .  enoxaparin (LOVENOX) injection  40 mg Subcutaneous Q24H  . feeding supplement  237 mL Oral BID BM  . feeding supplement  30 mL Oral TID WC  . multivitamin with minerals  1 tablet Oral Daily  . potassium chloride SA  20 mEq Oral BID  . sodium  chloride  3 mL Intravenous Q12H  . thiamine  100 mg Oral Daily   Continuous Infusions: . sodium chloride 75 mL/hr at 07/07/13 1617   PRN Meds:sodium chloride, sodium chloride  Assessment/Plan:  AMS: improving,  Hyponatremia: cont ns iv  Hypertension uncontrolled, cont amlodipine to 5mg  poqday consider  Anemia: await spep, upep, awaiting hemeoccult stool  Hypoklaemia: resolved  Abnormal lft: stable  Weight loss: tsh pending Protein calorie malnutrition: nutrition consult  Gait instability: PT /OT to evaluate and tx,  Dispo: needs placement at SNF  LOS 2 days  Jani Gravel

## 2013-07-08 NOTE — Clinical Social Work Note (Signed)
Pt d/c today to [facility]. [name] and facility aware and agreeable. D/C summary faxed. Pt to transfer with daughter. Out of facility DNR in packet.  Benay Pike, [place]

## 2013-07-08 NOTE — Clinical Social Work Placement (Signed)
Clinical Social Work Department CLINICAL SOCIAL WORK PLACEMENT NOTE 07/08/2013  Patient:  Sarah Parsons, Sarah Parsons  Account Number:  [id number] Admit date:  07/05/2013  Clinical Social Worker:  Benay Pike, LCSW  Date/time:  07/06/2013 01:55 PM  Clinical Social Work is seeking post-discharge placement for this patient at the following level of care:   [place]   (*CSW will update this form in Epic as items are completed)   07/06/2013  Patient/family provided with [place] Department of Clinical Social Work's list of facilities offering this level of care within the geographic area requested by the patient (or if unable, by the patient's family).  07/06/2013  Patient/family informed of their freedom to choose among providers that offer the needed level of care, that participate in Medicare, Medicaid or managed care program needed by the patient, have an available bed and are willing to accept the patient.  07/06/2013  Patient/family informed of [facility]' ownership interest in [facility], as well as of the fact that they are under no obligation to receive care at this facility.  PASARR submitted to EDS on 07/06/2013 PASARR number received from EDS on 07/06/2013  FL2 transmitted to all facilities in geographic area requested by pt/family on  07/06/2013 FL2 transmitted to all facilities within larger geographic area on   Patient informed that his/her managed care company has contracts with or will negotiate with  certain facilities, including the following:     Patient/family informed of bed offers received:  07/06/2013 Patient chooses bed at [facility] Physician recommends and patient chooses bed at  [facility]  Patient to be transferred to [facility] on  07/08/2013 Patient to be transferred to facility by family  The following physician request were entered in Epic:   Additional Comments:   Benay Pike, [place]

## 2013-07-11 LAB — UIFE/LIGHT CHAINS/TP QN, 24-HR UR
Alpha 2, Urine: NOT DETECTED
Beta, Urine: NOT DETECTED
Free Lt Chn Excr Rate: 0.91 mg/d
Gamma Globulin, Urine: NOT DETECTED
Total Protein, Urine-Ur/day: 5 mg/d — ABNORMAL LOW (ref 10–140)

## 2015-04-25 IMAGING — CT CT ABD-PELV W/ CM
2 of 4 series · 16 of 46 positions shown, 18 images · IV contrast (omnipaque)
Comparison: 11/17/2012

CLINICAL DATA: History of cholelithiasis and weight loss

CT ABDOMEN AND PELVIS WITH CONTRAST
TECHNIQUE: Multidetector CT imaging of the abdomen and pelvis was
performed following the standard protocol during bolus
administration of intravenous contrast.
Contrast: 100mL OMNIPAQUE IOHEXOL 300 MG/ML  SOLN

[Series 2: abd_pel_with 5.0 b40f · axial · 0.65mm/px · z∈[-374,+41]mm · 13 of 93 slices shown, 15 images]
[im 5/93  soft-tissue]
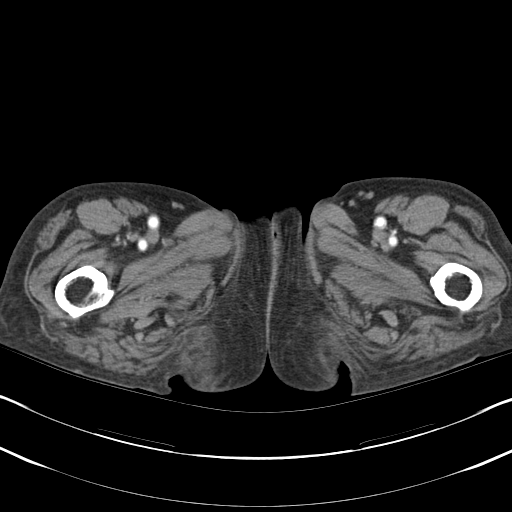
[im 5/93  bone]
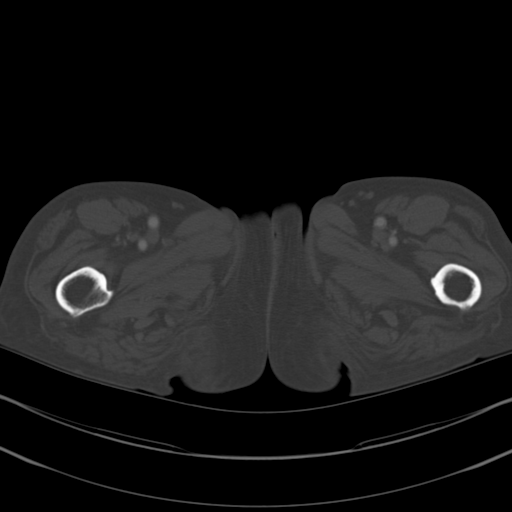
[im 15/93  soft-tissue]
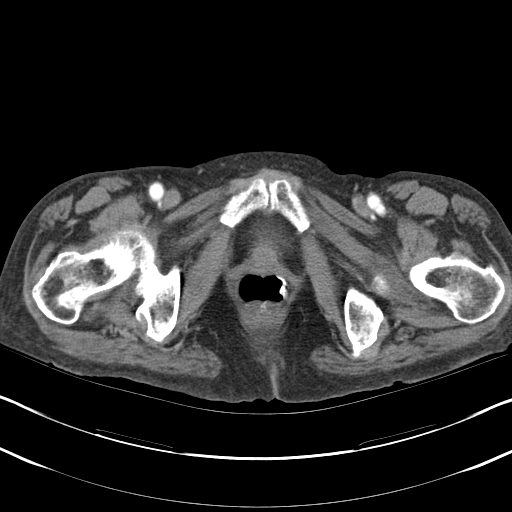
[im 20/93  soft-tissue]
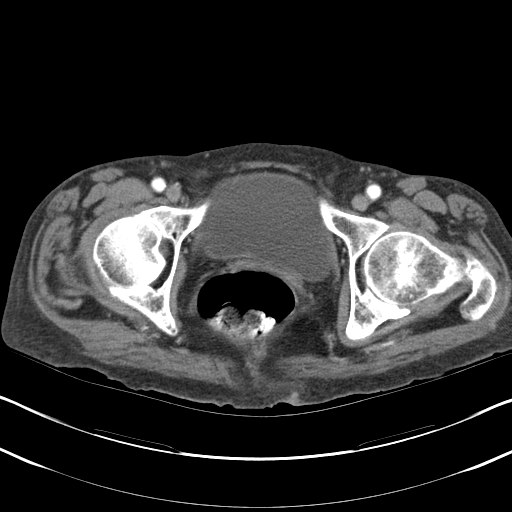
[im 25/93  soft-tissue]
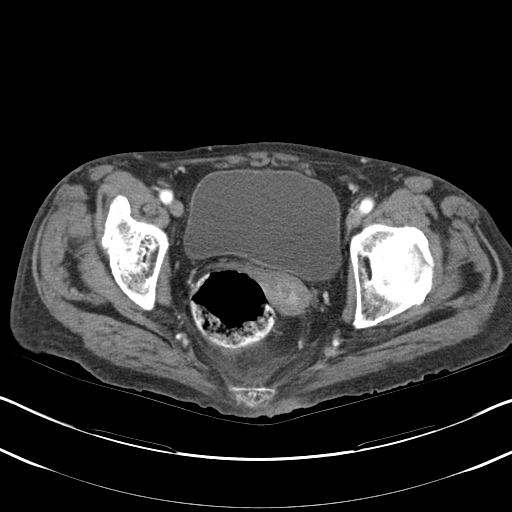
[im 34/93  soft-tissue]
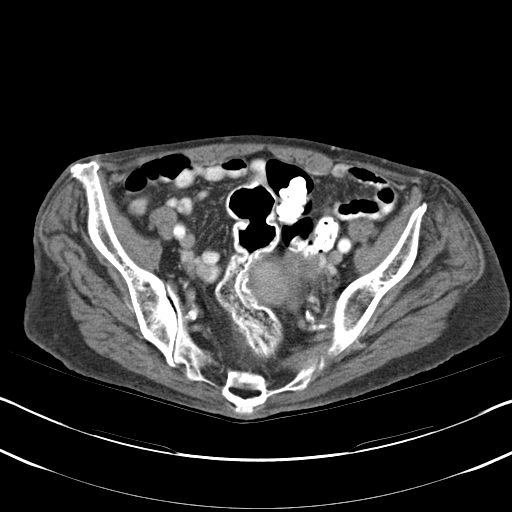
[im 39/93  soft-tissue]
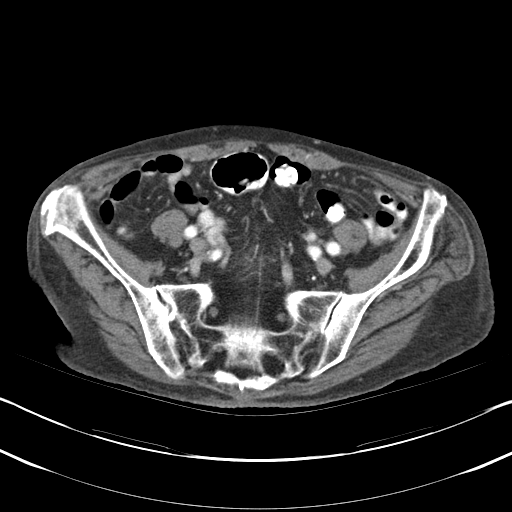
[im 49/93  soft-tissue]
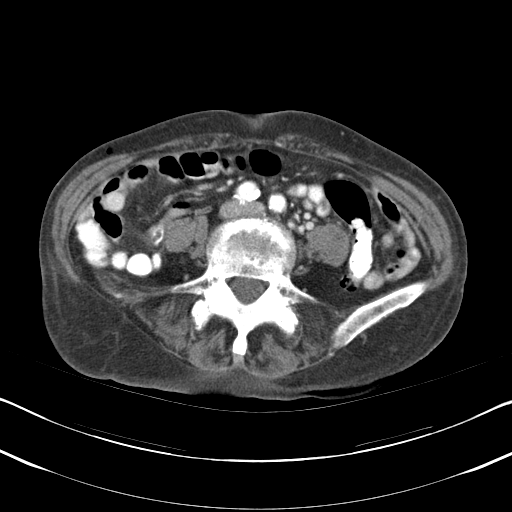
[im 54/93  soft-tissue]
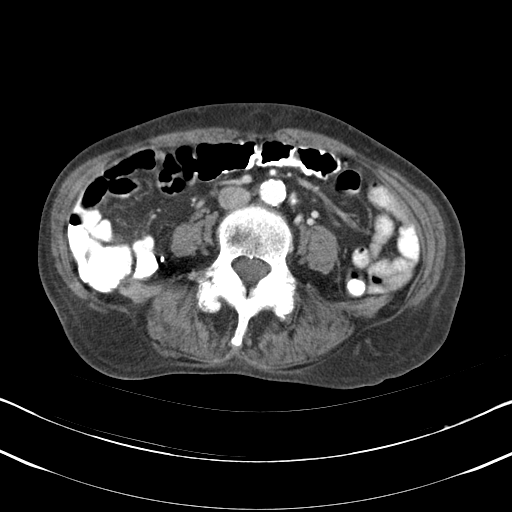
[im 59/93  soft-tissue]
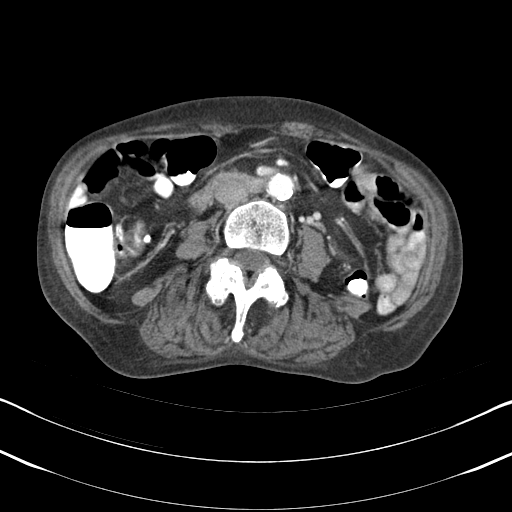
[im 59/93  bone]
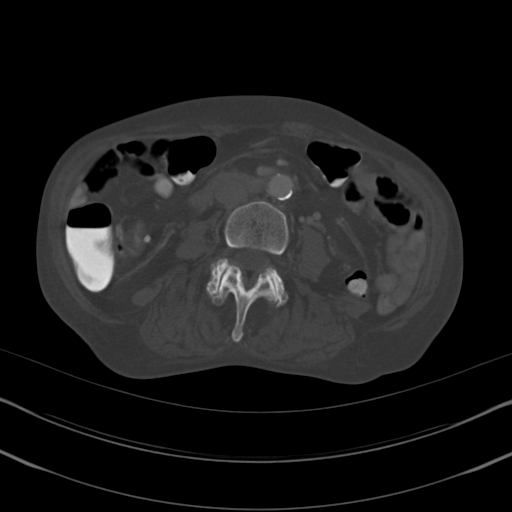
[im 68/93  soft-tissue]
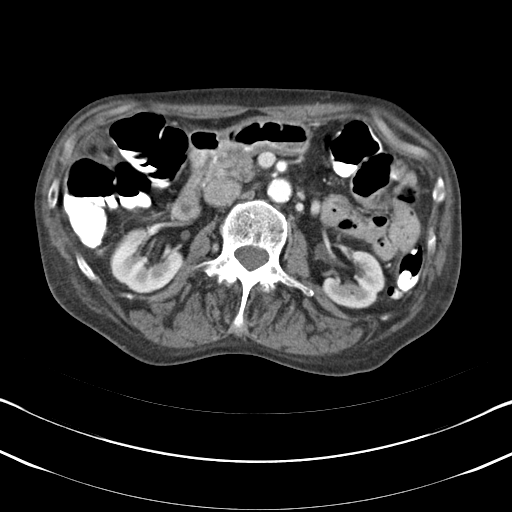
[im 73/93  soft-tissue]
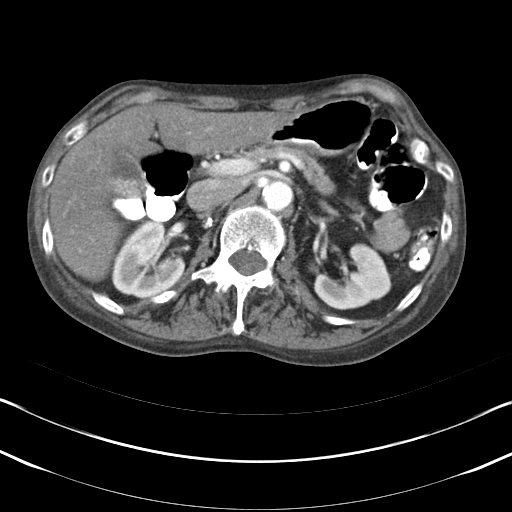
[im 78/93  soft-tissue]
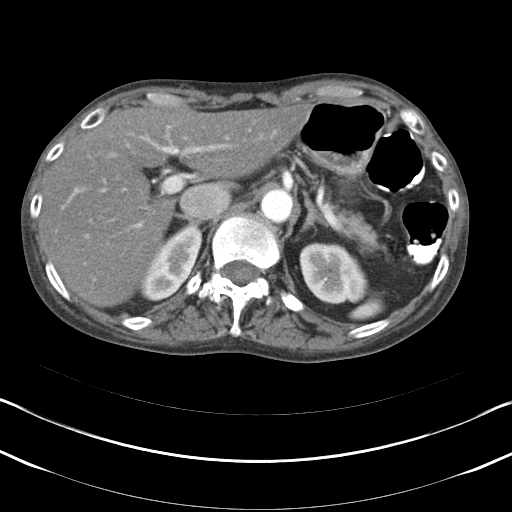
[im 88/93  soft-tissue]
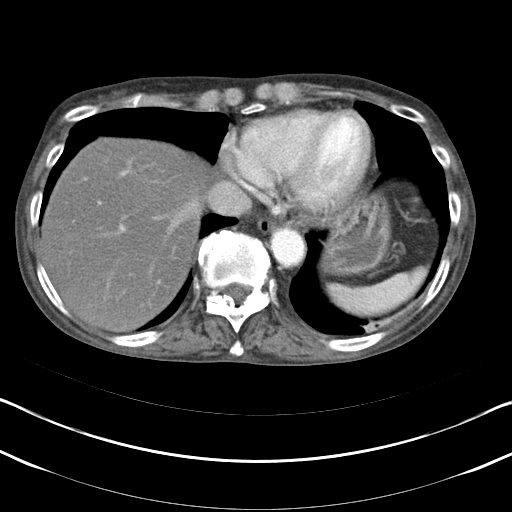

[Series 4: abd_pel_with 3.0 spo cor · coronal · 0.61mm/px · 3 of 62 slices shown]
[im 21/62  soft-tissue]
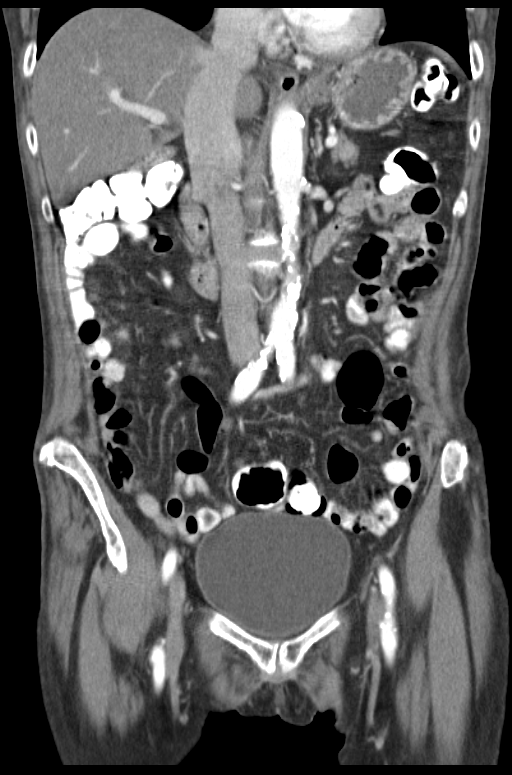
[im 28/62  soft-tissue]
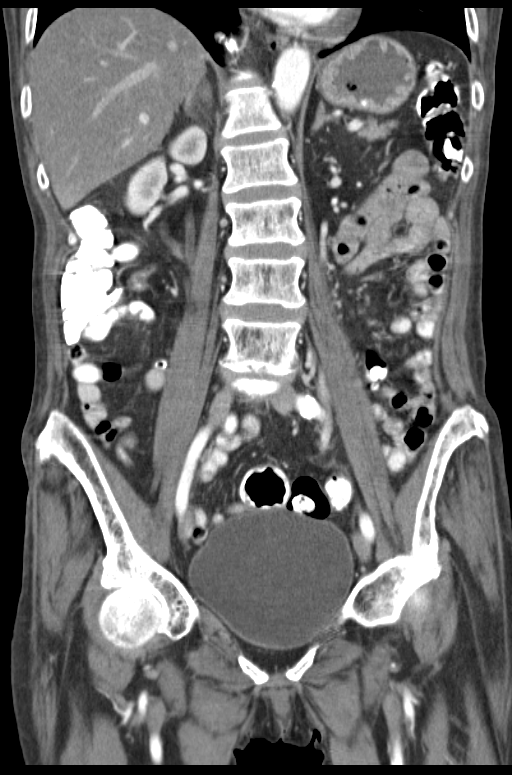
[im 34/62  soft-tissue]
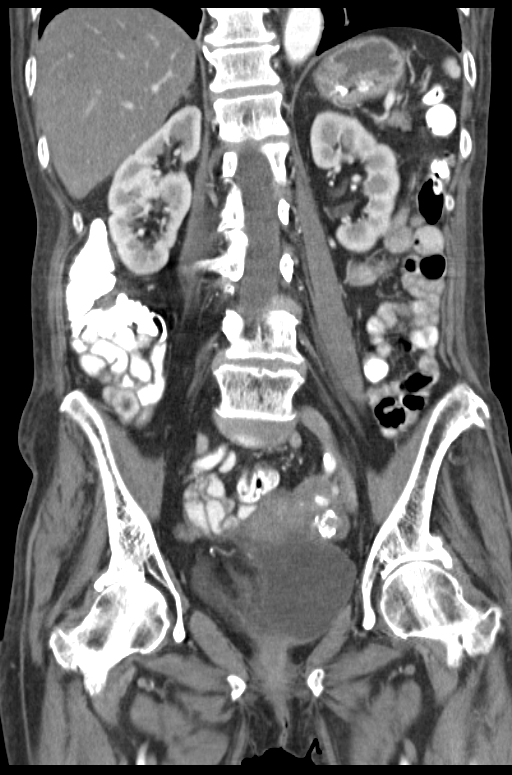

[16 of 46 positions shown; findings below may reference images not displayed]

FINDINGS: The lung bases demonstrate some mild atelectatic changes
bilaterally.

Diffuse fatty infiltration of the liver is seen.  Multiple small
gallstones are noted within the dependent portion of the
gallbladder.  No obstructive changes are noted.  The spleen,
adrenal glands, pancreas and kidneys are within normal limits.

The appendix is well visualized within normal limits.  Scattered
diverticular change of the colon is noted.  No diverticulitis is
seen.  Calcified uterine fibroids are seen.  The bladder is well
distended with urine.  Atherosclerotic change of the aorta is noted
without aneurysmal dilatation.  No acute bony abnormality is noted.
IMPRESSION: Stable cholelithiasis.

No acute abnormality is noted.

## 2015-10-03 ENCOUNTER — Telehealth: Payer: Self-pay

## 2015-10-03 NOTE — Telephone Encounter (Signed)
T/C from [place] at [facility], pt needs appt for screening colonoscopy. She had med list faxed over and pt has hx of alcohol abuse. OV scheduled with Walden Field, NP on 10/17/2015 at 9:30 Am.  I spoke with nurse this AM, Tandy Gaw, who said pt is able to sign for herself. Cloyde Reams will check with daughter and see if she will want to come with her. Pt's husband also has appt today with Randall Hiss for screening colonoscopy.

## 2015-10-17 ENCOUNTER — Other Ambulatory Visit: Payer: Self-pay

## 2015-10-17 ENCOUNTER — Encounter: Payer: Self-pay | Admitting: Nurse Practitioner

## 2015-10-17 ENCOUNTER — Ambulatory Visit (INDEPENDENT_AMBULATORY_CARE_PROVIDER_SITE_OTHER): Payer: Medicare Other | Admitting: Nurse Practitioner

## 2015-10-17 VITALS — BP 139/77 | HR 109 | Temp 98.0°F | Ht 67.0 in

## 2015-10-17 DIAGNOSIS — F1010 Alcohol abuse, uncomplicated: Secondary | ICD-10-CM | POA: Diagnosis not present

## 2015-10-17 DIAGNOSIS — Z1211 Encounter for screening for malignant neoplasm of colon: Secondary | ICD-10-CM | POA: Insufficient documentation

## 2015-10-17 MED ORDER — PHOSPHATE LAXATIVE 2.7-7.2 GM/15ML PO SOLN: 15.0000 mL | Freq: Once | ORAL | Status: DC

## 2015-10-17 MED ORDER — PEG 3350-KCL-NA BICARB-NACL 420 G PO SOLR: 4000.0000 mL | Freq: Once | ORAL | Status: DC

## 2015-10-17 MED ORDER — BISACODYL 5 MG PO TBEC: 5.0000 mg | DELAYED_RELEASE_TABLET | Freq: Every day | ORAL | Status: DC | PRN

## 2015-10-17 NOTE — Assessment & Plan Note (Signed)
History of alcohol abuse. Has not had alcohol in approximately 1 year since being admitted to [facility]. Prior to that she drank approximately half pint of liquor a day for the previous 10 years. Due to this will use propofol/MAC for her colonoscopy as noted above.

## 2015-10-17 NOTE — Progress Notes (Signed)
Primary Care Physician:  Jani Gravel, MD Primary Gastroenterologist:  Dr. Oneida Alar  Chief Complaint  Patient presents with  . Colonoscopy    HPI:   69 year old female presents on referral from primary care for colonoscopy. She is brought into the office rather than from triage due to history of alcohol abuse. She is accompanied by her husband who is also here for colonoscopy evaluation. She currently resides at [facility] in [place]. A colonoscopy records found in our system.  Today she states she has never had a colonoscopy before. Denies abdominal pain, N/V, melena. Admits occasional hematochezia with blood noted on the stool and in the toilet. Denies history of hemorrhoids.Denies change in bowel habits, unintentional weight loss, fever, chills. Denies chest pain, dyspnea, dizziness, lightheadedness, syncope, near syncope. Denies any other upper or lower GI symptoms.   Past Medical History  Diagnosis Date  . Tobacco abuse   . UTI (urinary tract infection) 11/17/2012  . Hypomagnesemia 11/17/2012  . Leukopenia 11/17/2012  . Cholelithiasis 11/17/2012  . Fatty liver 11/17/2012  . 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 FTT (failure to thrive) in adult 11/16/2012  . Hypertension   . Alcoholism /alcohol abuse ([place])   . 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 Anemia   . Hypokalemia     No past surgical history on file.  Current Outpatient Prescriptions  Medication Sig Dispense Refill  . amLODipine (NORVASC) 5 MG tablet Take 1 tablet (5 mg total) by mouth daily. 30 tablet 5  . atorvastatin (LIPITOR) 10 MG tablet     . cholecalciferol (VITAMIN D) 1000 UNITS tablet Take 1,000 Units by mouth daily.    . Fish Oil OIL Take 1,000 mg by mouth 2 (two) times daily.    . Multiple Vitamin (MULTIVITAMIN WITH MINERALS) TABS Take 1 tablet by mouth daily.    . potassium chloride SA (K-DUR,KLOR-CON) 20 MEQ tablet Take 1 tablet (20 mEq total) by mouth 2 (two) times  daily. 8 tablet 0  . thiamine 100 MG tablet Take 1 tablet (100 mg total) by mouth daily.     No current facility-administered medications for this visit.    Allergies as of 10/17/2015  . (No Known Allergies)    No family history on file.  Social History   Social History  . Marital Status: Married    Spouse Name: Clinical research associate Sr.   . Number of Children: 2  . Years of Education: 12    Occupational History  . Not on file.   Social History Main Topics  . Smoking status: Former Smoker -- 1.00 packs/day for 40 years    Types: Cigarettes    Quit date: 06/28/2013  . Smokeless tobacco: Not on file  . Alcohol Use: No     Comment: daily  . Drug Use: No  . Sexual Activity: Not on file   Other Topics Concern  . Not on file   Social History Narrative   Born: Therapist, music   Occupation: ?   Children:  2 ,  [name] (daughter), and Leone King.  (son)   Hobbies:  none    Review of Systems: 10-point ROS negative except as per HPI.    Physical Exam: BP 139/77 mmHg  Pulse 109  Temp(Src) 98 F (36.7 C) (Oral)  Ht 5\' 7"  (1.702 m) General:   Alert and oriented. Pleasant and cooperative. Well-nourished and well-developed. Sitting in wheelchair. Head:  Normocephalic and atraumatic. Eyes:  Without icterus, sclera clear and  conjunctiva pink.  Ears:  Normal auditory acuity. Cardiovascular:  S1, S2 present without murmurs appreciated. Extremities without clubbing or edema. Respiratory:  Clear to auscultation bilaterally. No wheezes, rales, or rhonchi. No distress.  Gastrointestinal:  +BS, soft, non-tender and non-distended. No HSM noted. No guarding or rebound. No masses appreciated.  Rectal:  Deferred  Psych:  Alert and cooperative. Normal mood and affect. Heme/Lymph/Immune: No excessive bruising noted.    10/17/2015 10:15 AM

## 2015-10-17 NOTE — Assessment & Plan Note (Signed)
69 year old female presents for first-ever screening colonoscopy. History of alcohol abuse as noted below. Notes occasional hematochezia with bowel movements. Otherwise asymptomatic from a GI standpoint. We'll proceed with first-ever colonoscopy in the OR with propofol/MAC.  Patient is not on any anticoagulants, antidepressants, anxiolytics, her chronic pain medications. History of alcohol abuse as noted below. For this reason we'll provide for the procedure in the OR on propofol/MAC to promote adequate sedation

## 2015-10-17 NOTE — Progress Notes (Signed)
cc'ed to pcp °

## 2015-10-17 NOTE — Patient Instructions (Signed)
1. We will schedule your procedure for you. 2. Further recommendations to be based on results of your procedure.

## 2015-11-08 NOTE — Patient Instructions (Signed)
Sarah Parsons  11/08/2015     @PREFPERIOPPHARMACY @   Your procedure is scheduled on 11/13/2015  Report to [facility] at 11:30 A.M.  Call this number if you have problems the morning of surgery:  [phone]   Remember:  Do not eat food or drink liquids after midnight.  Take these medicines the morning of surgery with A SIP OF WATER Amlodipine   Do not wear jewelry, make-up or nail polish.  Do not wear lotions, powders, or perfumes.  You may wear deodorant.  Do not shave 48 hours prior to surgery.  Men may shave face and neck.  Do not bring valuables to the hospital.  [facility] is not responsible for any belongings or valuables.  Contacts, dentures or bridgework may not be worn into surgery.  Leave your suitcase in the car.  After surgery it may be brought to your room.  For patients admitted to the hospital, discharge time will be determined by your treatment team.  Patients discharged the day of surgery will not be allowed to drive home.    Please read over the following fact sheets that you were given. Anesthesia Post-op Instructions     PATIENT INSTRUCTIONS POST-ANESTHESIA  IMMEDIATELY FOLLOWING SURGERY:  Do not drive or operate machinery for the first twenty four hours after surgery.  Do not make any important decisions for twenty four hours after surgery or while taking narcotic pain medications or sedatives.  If you develop intractable nausea and vomiting or a severe headache please notify your doctor immediately.  FOLLOW-UP:  Please make an appointment with your surgeon as instructed. You do not need to follow up with anesthesia unless specifically instructed to do so.  WOUND CARE INSTRUCTIONS (if applicable):  Keep a dry clean dressing on the anesthesia/puncture wound site if there is drainage.  Once the wound has quit draining you may leave it open to air.  Generally you should leave the bandage intact for twenty four hours unless there is drainage.  If the  epidural site drains for more than 36-48 hours please call the anesthesia department.  QUESTIONS?:  Please feel free to call your physician or the hospital operator if you have any questions, and they will be happy to assist you.      Colonoscopy A colonoscopy is an exam to look at the entire large intestine (colon). This exam can help find problems such as tumors, polyps, inflammation, and areas of bleeding. The exam takes about 1 hour.  LET [facility] CARE PROVIDER KNOW ABOUT:   Any allergies you have.  All medicines you are taking, including vitamins, herbs, eye drops, creams, and over-the-counter medicines.  Previous problems you or members of your family have had with the use of anesthetics.  Any blood disorders you have.  Previous surgeries you have had.  Medical conditions you have. RISKS AND COMPLICATIONS  Generally, this is a safe procedure. However, as with any procedure, complications can occur. Possible complications include:  Bleeding.  Tearing or rupture of the colon wall.  Reaction to medicines given during the exam.  Infection (rare). BEFORE THE PROCEDURE   Ask your health care provider about changing or stopping your regular medicines.  You may be prescribed an oral bowel prep. This involves drinking a large amount of medicated liquid, starting the day before your procedure. The liquid will cause you to have multiple loose stools until your stool is almost clear or light green. This cleans out your colon in preparation for the  procedure.  Do not eat or drink anything else once you have started the bowel prep, unless your health care provider tells you it is safe to do so.  Arrange for someone to drive you home after the procedure. PROCEDURE   You will be given medicine to help you relax (sedative).  You will lie on your side with your knees bent.  A long, flexible tube with a light and camera on the end (colonoscope) will be inserted through the rectum  and into the colon. The camera sends video back to a computer screen as it moves through the colon. The colonoscope also releases carbon dioxide gas to inflate the colon. This helps your health care provider see the area better.  During the exam, your health care provider may take a small tissue sample (biopsy) to be examined under a microscope if any abnormalities are found.  The exam is finished when the entire colon has been viewed. AFTER THE PROCEDURE   Do not drive for 24 hours after the exam.  You may have a small amount of blood in your stool.  You may pass moderate amounts of gas and have mild abdominal cramping or bloating. This is caused by the gas used to inflate your colon during the exam.  Ask when your test results will be ready and how you will get your results. Make sure you get your test results.   This information is not intended to replace advice given to you by your health care provider. Make sure you discuss any questions you have with your health care provider.   Document Released: 10/17/2000 Document Revised: 08/10/2013 Document Reviewed: 06/27/2013 Elsevier Interactive Patient Education [employer].

## 2015-11-09 ENCOUNTER — Encounter (HOSPITAL_COMMUNITY): Payer: Self-pay

## 2015-11-09 ENCOUNTER — Encounter (HOSPITAL_COMMUNITY)
Admission: RE | Admit: 2015-11-09 | Discharge: 2015-11-09 | Disposition: A | Discharge status: Home / Self Care | Payer: Medicare Other | Source: Ambulatory Visit | Attending: Gastroenterology | Admitting: Gastroenterology

## 2015-11-09 ENCOUNTER — Other Ambulatory Visit: Payer: Self-pay

## 2015-11-09 DIAGNOSIS — Z01818 Encounter for other preprocedural examination: Secondary | ICD-10-CM | POA: Diagnosis not present

## 2015-11-09 HISTORY — DX: Pure hypercholesterolemia, unspecified: E78.00

## 2015-11-09 LAB — BASIC METABOLIC PANEL
ANION GAP: 9 (ref 5–15)
BUN: 23 mg/dL — ABNORMAL HIGH (ref 6–20)
CHLORIDE: 105 mmol/L (ref 101–111)
CO2: 26 mmol/L (ref 22–32)
CREATININE: 0.98 mg/dL (ref 0.44–1.00)
Calcium: 9.5 mg/dL (ref 8.9–10.3)
GFR calc non Af Amer: 58 mL/min — ABNORMAL LOW (ref >60)
Glucose, Bld: 130 mg/dL — ABNORMAL HIGH (ref 65–99)
POTASSIUM: 4.9 mmol/L (ref 3.5–5.1)
SODIUM: 140 mmol/L (ref 135–145)

## 2015-11-09 LAB — CBC
HEMATOCRIT: 39.9 % (ref 36.0–46.0)
HEMOGLOBIN: 13 g/dL (ref 12.0–15.0)
MCH: 30.2 pg (ref 26.0–34.0)
MCHC: 32.6 g/dL (ref 30.0–36.0)
MCV: 92.6 fL (ref 78.0–100.0)
Platelets: 210 10*3/uL (ref 150–400)
RBC: 4.31 MIL/uL (ref 3.87–5.11)
RDW: 12.1 % (ref 11.5–15.5)
WBC: 9.3 10*3/uL (ref 4.0–10.5)

## 2015-11-09 NOTE — Pre-Procedure Instructions (Signed)
Patient and husband in for PAT accompanied by Joni Fears, CNA. Spoke with patients daughter, [name] who will be here on 11/13/2015 to sign consents for patients procedure.

## 2015-11-12 ENCOUNTER — Ambulatory Visit (HOSPITAL_COMMUNITY): Payer: Medicare Other | Admitting: Anesthesiology

## 2015-11-13 ENCOUNTER — Other Ambulatory Visit: Payer: Self-pay

## 2015-11-13 ENCOUNTER — Telehealth: Payer: Self-pay

## 2015-11-13 NOTE — Telephone Encounter (Signed)
Spoke with nurse(Cindy Pruitt) at [facility] about her not having her Prep for her TCS today. She stated that it was on them and she looking into it and will call us back.

## 2015-11-13 NOTE — Telephone Encounter (Signed)
No changes to history noted.  Pt has been rescheduled for procedure on 12/04/2015 at 0930am Faxing new instructions and prep RX

## 2015-11-13 NOTE — Telephone Encounter (Signed)
Faxed instructions and prep RX

## 2015-11-13 NOTE — Telephone Encounter (Signed)
Late entry: Spoke with nurse at [facility] yesterday about there her time being moved up since others had cancled. I when over the instructions with her and she understood.

## 2015-11-22 ENCOUNTER — Other Ambulatory Visit: Payer: Self-pay

## 2015-11-22 DIAGNOSIS — I2129 ST elevation (STEMI) myocardial infarction involving other sites: Secondary | ICD-10-CM

## 2015-11-22 NOTE — Telephone Encounter (Signed)
Daughter is aware. Referral to cardiology complete

## 2015-11-22 NOTE — Telephone Encounter (Signed)
Spoke with Abigail Butts at [facility]. She is aware of Referral to [facility]

## 2015-11-22 NOTE — Telephone Encounter (Addendum)
PLEASE CALL PT. HER ECG WAS NOT NORMAL. SHE SHOULD SEE CARDIOLOGY-DX: SEPTAL INFARCT AGE UNDETERMINED.

## 2015-11-26 NOTE — Telephone Encounter (Signed)
Noted  

## 2015-11-29 ENCOUNTER — Encounter: Payer: Self-pay | Admitting: Cardiovascular Disease

## 2015-11-29 ENCOUNTER — Encounter (HOSPITAL_COMMUNITY)
Admission: RE | Admit: 2015-11-29 | Discharge: 2015-11-29 | Disposition: A | Discharge status: Home / Self Care | Payer: Medicare Other | Source: Ambulatory Visit | Attending: Gastroenterology | Admitting: Gastroenterology

## 2015-11-29 ENCOUNTER — Ambulatory Visit (INDEPENDENT_AMBULATORY_CARE_PROVIDER_SITE_OTHER): Payer: Medicare Other | Admitting: Cardiovascular Disease

## 2015-11-29 ENCOUNTER — Encounter (HOSPITAL_COMMUNITY): Payer: Self-pay

## 2015-11-29 VITALS — BP 110/82 | HR 104 | Ht 67.0 in | Wt 168.0 lb

## 2015-11-29 DIAGNOSIS — I4891 Unspecified atrial fibrillation: Secondary | ICD-10-CM | POA: Diagnosis not present

## 2015-11-29 DIAGNOSIS — I10 Essential (primary) hypertension: Secondary | ICD-10-CM | POA: Diagnosis not present

## 2015-11-29 DIAGNOSIS — Z7189 Other specified counseling: Secondary | ICD-10-CM | POA: Diagnosis not present

## 2015-11-29 DIAGNOSIS — E785 Hyperlipidemia, unspecified: Secondary | ICD-10-CM

## 2015-11-29 DIAGNOSIS — I2129 ST elevation (STEMI) myocardial infarction involving other sites: Secondary | ICD-10-CM

## 2015-11-29 MED ORDER — APIXABAN 5 MG PO TABS: 5.0000 mg | ORAL_TABLET | Freq: Two times a day (BID) | ORAL | Status: AC

## 2015-11-29 MED ORDER — METOPROLOL SUCCINATE ER 25 MG PO TB24: 25.0000 mg | ORAL_TABLET | Freq: Two times a day (BID) | ORAL | Status: AC

## 2015-11-29 NOTE — Patient Instructions (Signed)
   Begin Toprol XL 25mg  twice a day.  Begin Eliquis 5mg  twice a day.  Stop Amlodipine. Continue all other medications.   Follow up with anticoagulation nurse [name]) in 1 month. Follow up in  1 week with MD.

## 2015-11-29 NOTE — Progress Notes (Signed)
Patient ID: Sarah Parsons, female   DOB: 02/21/1946, 70 y.o.   MRN: [id number]       CARDIOLOGY CONSULT NOTE  Patient ID: Sarah Parsons MRN: [id number] DOB/AGE: April 10, 1946 70 y.o.  Admit date: (Not on file) Primary Physician Jani Gravel, MD  Reason for Consultation: abnormal ECG  HPI: The patient is a 70 year old woman who is referred for an abnormal ECG. She has a history of hypertension and hyperlipidemia.  ECG performed on 11/09/15 demonstrated sinus rhythm with a mild nonspecific ST segment abnormality and possible old septal infarct.  She denies any history of myocardial infarction. She does some walking and denies exertional chest pain and shortness of breath. She denies leg swelling, orthopnea, and paroxysmal nocturnal dyspnea. Her only complaints relate to abdominal/vaginal pain.  On physical examination her heart was tachycardic and irregular. I obtained an ECG which demonstrated rapid atrial fibrillation, heart rate 136 bpm, with septal infarct again noted.  11/09/15 labs: Hgb 13, BUN 23, creat 0.98  No Known Allergies  Current Outpatient Prescriptions  Medication Sig Dispense Refill  . acetaminophen (TYLENOL) 500 MG tablet Take 500 mg by mouth 2 (two) times daily.    Marland Kitchen amLODipine (NORVASC) 5 MG tablet Take 1 tablet (5 mg total) by mouth daily. 30 tablet 5  . atorvastatin (LIPITOR) 10 MG tablet Take 10 mg by mouth daily at 6 PM.     . Fish Oil OIL Take 1,000 mg by mouth 2 (two) times daily.    . Multiple Vitamin (MULTIVITAMIN WITH MINERALS) TABS Take 1 tablet by mouth daily.    . potassium chloride SA (K-DUR,KLOR-CON) 20 MEQ tablet Take 1 tablet (20 mEq total) by mouth 2 (two) times daily. 8 tablet 0  . thiamine 100 MG tablet Take 1 tablet (100 mg total) by mouth daily.     No current facility-administered medications for this visit.    Past Medical History  Diagnosis Date  . Tobacco abuse   . UTI (urinary tract infection) 11/17/2012  . Cholelithiasis  11/17/2012  . Fatty liver 11/17/2012  . 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 FTT (failure to thrive) in adult 11/16/2012  . Hypertension   . Alcoholism /alcohol abuse ([place])   . 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 Anemia   . Hypercholesteremia     No past surgical history on file.  Social History   Social History  . Marital Status: Married    Spouse Name: Clinical research associate Sr.   . Number of Children: 2  . Years of Education: 12    Occupational History  . Not on file.   Social History Main Topics  . Smoking status: Former Smoker -- 1.00 packs/day for 40 years    Types: Cigarettes    Start date: 06/26/1960    Quit date: 06/28/2013  . Smokeless tobacco: Never Used  . Alcohol Use: No     Comment: Not currently; Last ETOH 10/2014, previously 1/2 pint daily for 8-10 years.  . Drug Use: No  . Sexual Activity: Not on file   Other Topics Concern  . Not on file   Social History Narrative   Born: Therapist, music   Occupation: ?   Children:  2 ,  [name] (daughter), and Daylani Loudenslager.  (son)   Hobbies:  none     No family history of premature CAD in 1st degree relatives.  Prior to Admission medications   Medication Sig Start Date End Date Taking? Authorizing Provider  acetaminophen (TYLENOL)  500 MG tablet Take 500 mg by mouth 2 (two) times daily.   Yes Historical Provider, MD  amLODipine (NORVASC) 5 MG tablet Take 1 tablet (5 mg total) by mouth daily. 07/07/13  Yes Jani Gravel, MD  atorvastatin (LIPITOR) 10 MG tablet Take 10 mg by mouth daily at 6 PM.  10/01/15  Yes Historical Provider, MD  Fish Oil OIL Take 1,000 mg by mouth 2 (two) times daily.   Yes Historical Provider, MD  Multiple Vitamin (MULTIVITAMIN WITH MINERALS) TABS Take 1 tablet by mouth daily. 11/17/12  Yes Rexene Alberts, MD  potassium chloride SA (K-DUR,KLOR-CON) 20 MEQ tablet Take 1 tablet (20 mEq total) by mouth 2 (two) times daily. 07/03/13  Yes Rolland Porter, MD    thiamine 100 MG tablet Take 1 tablet (100 mg total) by mouth daily. 11/17/12  Yes Rexene Alberts, MD     Review of systems complete and found to be negative unless listed above in HPI     Physical exam Blood pressure 110/82, pulse 104, height 5\' 7"  (1.702 m), weight 168 lb (76.204 kg). General: NAD Neck: No JVD, no thyromegaly or thyroid nodule.  Lungs: Clear to auscultation bilaterally with normal respiratory effort. CV: Nondisplaced PMI. Tachycardic, irregular rhythm, normal S1/S2, no S3, no murmur.  No peripheral edema.   Abdomen: Soft, no distention.  Skin: Intact without lesions or rashes.  Neurologic: Alert and oriented.  Psych: Normal affect. Extremities: No clubbing or cyanosis.  HEENT: Normal.   ECG: Most recent ECG reviewed.  Labs:   Lab Results  Component Value Date   WBC 9.3 11/09/2015   HGB 13.0 11/09/2015   HCT 39.9 11/09/2015   MCV 92.6 11/09/2015   PLT 210 11/09/2015   No results for input(s): NA, K, CL, CO2, BUN, CREATININE, CALCIUM, PROT, BILITOT, ALKPHOS, ALT, AST, GLUCOSE in the last 168 hours.  Invalid input(s): LABALBU Lab Results  Component Value Date   TROPONINI <0.30 11/16/2012   No results found for: CHOL No results found for: HDL No results found for: LDLCALC No results found for: TRIG No results found for: CHOLHDL No results found for: LDLDIRECT       Studies: No results found.  ASSESSMENT AND PLAN:  1. Rapid atrial fibrillation: She appears to be asymptomatic. Will start Toprol XL 25 mg bid for HR control. CHA2DS2VASc score is 3 thus elevated thromboembolic risk. Will initiate Eliquis 5 mg bid. Will arrange for initial anticoagulation visit. Will arrange for echocardiography once HR is controlled to evaluate cardiac structure and function.  2. Essential HTN: Controlled on amlodipine 5 mg. No changes.  3. Hyperlipidemia: On Lipitor 10 mg.  4. Septal infarct: Will arrange for echocardiography once HR is controlled to evaluate  cardiac structure and function.  Dispo: f/u 1 week   Signed: Kate Sable, M.D., F.A.C.C.  11/29/2015, 2:55 PM

## 2015-11-30 NOTE — Addendum Note (Signed)
Addended by: Merlene Laughter on: 11/30/2015 09:24 AM   Modules accepted: Orders

## 2015-12-04 ENCOUNTER — Ambulatory Visit (HOSPITAL_COMMUNITY)
Admission: RE | Admit: 2015-12-04 | Discharge: 2015-12-04 | Disposition: A | Discharge status: Home / Self Care | Payer: Medicare Other | Source: Ambulatory Visit | Attending: Gastroenterology | Admitting: Gastroenterology

## 2015-12-04 ENCOUNTER — Telehealth: Payer: Self-pay | Admitting: Gastroenterology

## 2015-12-04 ENCOUNTER — Encounter (HOSPITAL_COMMUNITY)
Admission: RE | Disposition: A | Discharge status: Home / Self Care | Payer: Self-pay | Source: Ambulatory Visit | Attending: Gastroenterology

## 2015-12-04 ENCOUNTER — Encounter (HOSPITAL_COMMUNITY): Payer: Self-pay | Admitting: Anesthesiology

## 2015-12-04 DIAGNOSIS — Z1211 Encounter for screening for malignant neoplasm of colon: Secondary | ICD-10-CM | POA: Diagnosis not present

## 2015-12-04 DIAGNOSIS — Z538 Procedure and treatment not carried out for other reasons: Secondary | ICD-10-CM | POA: Insufficient documentation

## 2015-12-04 DIAGNOSIS — Z7901 Long term (current) use of anticoagulants: Secondary | ICD-10-CM | POA: Diagnosis not present

## 2015-12-04 DIAGNOSIS — Z87891 Personal history of nicotine dependence: Secondary | ICD-10-CM | POA: Insufficient documentation

## 2015-12-04 DIAGNOSIS — Z79899 Other long term (current) drug therapy: Secondary | ICD-10-CM | POA: Insufficient documentation

## 2015-12-04 DIAGNOSIS — I10 Essential (primary) hypertension: Secondary | ICD-10-CM | POA: Insufficient documentation

## 2015-12-04 SURGERY — COLONOSCOPY WITH PROPOFOL
Anesthesia: Monitor Anesthesia Care

## 2015-12-04 MED ORDER — FENTANYL CITRATE (PF) 100 MCG/2ML IJ SOLN
25.0000 ug | INTRAMUSCULAR | Status: AC
  Administered 2015-12-04: 25 ug via INTRAVENOUS

## 2015-12-04 MED ORDER — PROPOFOL 10 MG/ML IV BOLUS
INTRAVENOUS | Status: AC
  Filled 2015-12-04: qty 40

## 2015-12-04 MED ORDER — MIDAZOLAM HCL 2 MG/2ML IJ SOLN
1.0000 mg | INTRAMUSCULAR | Status: DC | PRN
  Administered 2015-12-04: 1 mg via INTRAVENOUS

## 2015-12-04 MED ORDER — LACTATED RINGERS IV SOLN
INTRAVENOUS | Status: DC
  Administered 2015-12-04: 09:00:00 via INTRAVENOUS

## 2015-12-04 MED ORDER — FENTANYL CITRATE (PF) 100 MCG/2ML IJ SOLN
INTRAMUSCULAR | Status: AC
  Filled 2015-12-04: qty 2

## 2015-12-04 MED ORDER — MIDAZOLAM HCL 2 MG/2ML IJ SOLN
INTRAMUSCULAR | Status: AC
  Filled 2015-12-04: qty 2

## 2015-12-04 MED ORDER — LIDOCAINE VISCOUS 2 % MT SOLN
OROMUCOSAL | Status: AC
  Filled 2015-12-04: qty 15

## 2015-12-04 MED ORDER — LIDOCAINE HCL (PF) 1 % IJ SOLN
INTRAMUSCULAR | Status: AC
  Filled 2015-12-04: qty 5

## 2015-12-04 NOTE — Telephone Encounter (Signed)
PT SEEN AT [facility]. REFERRED TO CARDIOLOGY FOR ABNL ECG. SEEN BY CARDS IN [facility]. STARTED ON ELIQUIS JAN 26. LAST DOSE 9 PM LAST NIGHT ACCORDING TO [facility]. COLONOSCOPY NEEDS TO BE CANCELED. RESCHEDULE TCS IN 6 WEEKS. HOLD ELIQUIS 48 HRS PRIOR TO TCS.

## 2015-12-04 NOTE — Anesthesia Preprocedure Evaluation (Addendum)
Anesthesia Evaluation  Patient identified by MRN, date of birth, ID band Patient awake    Reviewed: Allergy & Precautions, NPO status , Patient's Chart, lab work & pertinent test results, reviewed documented beta blocker date and time   Airway Mallampati: I  TM Distance: >3 FB     Dental  (+) Poor Dentition, Missing, Chipped, Dental Advisory Given   Pulmonary former smoker,    breath sounds clear to auscultation       Cardiovascular hypertension, Pt. on medications and Pt. on home beta blockers  Rhythm:Regular Rate:Normal     Neuro/Psych  Alcoholic dementia, hx alcoholic encephalopathy      GI/Hepatic negative GI ROS, (+)     substance abuse  alcohol use, Hepatitis - (alcoholic)  Endo/Other    Renal/GU      Musculoskeletal   Abdominal   Peds  Hematology   Anesthesia Other Findings   Reproductive/Obstetrics                            Anesthesia Physical Anesthesia Plan  ASA: III  Anesthesia Plan: MAC   Post-op Pain Management:    Induction: Intravenous  Airway Management Planned: Simple Face Mask  Additional Equipment:   Intra-op Plan:   Post-operative Plan:   Informed Consent: I have reviewed the patients History and Physical, chart, labs and discussed the procedure including the risks, benefits and alternatives for the proposed anesthesia with the patient or authorized representative who has indicated his/her understanding and acceptance.     Plan Discussed with:   Anesthesia Plan Comments:         Anesthesia Quick Evaluation

## 2015-12-05 ENCOUNTER — Other Ambulatory Visit: Payer: Self-pay

## 2015-12-05 NOTE — Telephone Encounter (Signed)
Faxed orders to [facility].  Spoke with Daughter and she is aware of appts.

## 2015-12-05 NOTE — Telephone Encounter (Signed)
Procedure is set for 03/14 and pre-op is 03/09

## 2015-12-05 NOTE — Telephone Encounter (Signed)
Routing to [place] for H&P update prior to procedure

## 2015-12-05 NOTE — Telephone Encounter (Signed)
Noted. Will update prior to appt on 01/15/2016.

## 2015-12-07 ENCOUNTER — Ambulatory Visit: Payer: Medicare Other | Admitting: Cardiovascular Disease

## 2015-12-10 ENCOUNTER — Ambulatory Visit: Payer: Medicare Other | Admitting: Cardiovascular Disease

## 2015-12-11 ENCOUNTER — Ambulatory Visit: Payer: Medicare Other | Admitting: Cardiovascular Disease

## 2015-12-13 ENCOUNTER — Ambulatory Visit (INDEPENDENT_AMBULATORY_CARE_PROVIDER_SITE_OTHER): Payer: Medicare Other | Admitting: Cardiovascular Disease

## 2015-12-13 ENCOUNTER — Encounter: Payer: Self-pay | Admitting: Cardiovascular Disease

## 2015-12-13 VITALS — BP 136/80 | HR 62

## 2015-12-13 DIAGNOSIS — I480 Paroxysmal atrial fibrillation: Secondary | ICD-10-CM | POA: Diagnosis not present

## 2015-12-13 DIAGNOSIS — R9431 Abnormal electrocardiogram [ECG] [EKG]: Secondary | ICD-10-CM | POA: Diagnosis not present

## 2015-12-13 DIAGNOSIS — E785 Hyperlipidemia, unspecified: Secondary | ICD-10-CM

## 2015-12-13 DIAGNOSIS — I10 Essential (primary) hypertension: Secondary | ICD-10-CM

## 2015-12-13 DIAGNOSIS — Z7189 Other specified counseling: Secondary | ICD-10-CM

## 2015-12-13 DIAGNOSIS — I2129 ST elevation (STEMI) myocardial infarction involving other sites: Secondary | ICD-10-CM

## 2015-12-13 NOTE — Patient Instructions (Addendum)
Your physician wants you to follow-up in:  6 months with Dr Koneswaran You will receive a reminder letter in the mail two months in advance. If you don't receive a letter, please call our office to schedule the follow-up appointment.    Your physician has requested that you have an echocardiogram. Echocardiography is a painless test that uses sound waves to create images of your heart. It provides your doctor with information about the size and shape of your heart and how well your heart's chambers and valves are working. This procedure takes approximately one hour. There are no restrictions for this procedure.     Your physician recommends that you continue on your current medications as directed. Please refer to the Current Medication list given to you today.    If you need a refill on your cardiac medications before your next appointment, please call your pharmacy.      Thank you for choosing [facility] !        

## 2015-12-13 NOTE — Progress Notes (Signed)
Patient ID: Sarah Parsons, female   DOB: April 28, 1946, 70 y.o.   MRN: [id number]      SUBJECTIVE: The patient presents for follow up of newly diagnosed rapid atrial fibrillation. At her last visit I initiated Eliquis for anticoagulation and Toprol-XL 25 mg bid for rate control. She was asymptomatic.  She remains asymptomatic and denies bleeding problems.   Review of Systems: As per "subjective", otherwise negative.  No Known Allergies  Current Outpatient Prescriptions  Medication Sig Dispense Refill  . acetaminophen (TYLENOL) 500 MG tablet Take 500 mg by mouth 2 (two) times daily.    Marland Kitchen apixaban (ELIQUIS) 5 MG TABS tablet Take 1 tablet (5 mg total) by mouth 2 (two) times daily.    Marland Kitchen atorvastatin (LIPITOR) 10 MG tablet Take 10 mg by mouth daily at 6 PM.     . Fish Oil OIL Take 1,000 mg by mouth 2 (two) times daily.    . metoprolol succinate (TOPROL XL) 25 MG 24 hr tablet Take 1 tablet (25 mg total) by mouth 2 (two) times daily.    . Multiple Vitamin (MULTIVITAMIN WITH MINERALS) TABS Take 1 tablet by mouth daily.    . potassium chloride SA (K-DUR,KLOR-CON) 20 MEQ tablet Take 1 tablet (20 mEq total) by mouth 2 (two) times daily. 8 tablet 0  . thiamine 100 MG tablet Take 1 tablet (100 mg total) by mouth daily.     No current facility-administered medications for this visit.    Past Medical History  Diagnosis Date  . Tobacco abuse   . UTI (urinary tract infection) 11/17/2012  . Cholelithiasis 11/17/2012  . Fatty liver 11/17/2012  . 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[employer]) 11/16/2012  . FTT (failure to thrive) in adult 11/16/2012  . Hypertension   . Alcoholism /alcohol abuse ([place])   . 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 Anemia   . Hypercholesteremia     No past surgical history on file.  Social History   Social History  . Marital Status: Married    Spouse Name: Clinical research associate Sr.   . Number of Children: 2  . Years of Education: 12     Occupational History  . Not on file.   Social History Main Topics  . Smoking status: Former Smoker -- 1.00 packs/day for 40 years    Types: Cigarettes    Start date: 06/26/1960    Quit date: 06/28/2013  . Smokeless tobacco: Never Used  . Alcohol Use: No     Comment: Not currently; Last ETOH 10/2014, previously 1/2 pint daily for 8-10 years.  . Drug Use: No  . Sexual Activity: Not on file   Other Topics Concern  . Not on file   Social History Narrative   Born: Therapist, music   Occupation: ?   Children:  2 ,  [name] (daughter), and Konnie Kneedler.  (son)   Hobbies:  none     Filed Vitals:   12/13/15 1046  BP: 136/80  Pulse: 62  SpO2: 99%    PHYSICAL EXAM General: NAD Neck: No JVD, no thyromegaly or thyroid nodule.  Lungs: Clear to auscultation bilaterally with normal respiratory effort. CV: Nondisplaced PMI. Regular rate and rhythm, normal S1/S2, no S3/S4, no murmur. No peripheral edema.  Abdomen: Soft, no distention.  Skin: Intact without lesions or rashes.  Neurologic: Alert and oriented.  Psych: Normal affect. Extremities: No clubbing or cyanosis.  HEENT: Normal.   ECG: Most recent ECG reviewed.      ASSESSMENT AND  PLAN: 1. Rapid atrial fibrillation: She remains asymptomatic and has since converted to a regular rhythm on Toprol XL 25 mg bid, which I will continue. CHA2DS2VASc score is 3 thus elevated thromboembolic risk. Will continue Eliquis 5 mg bid and have already arranged for initial anticoagulation visit. Will arrange for echocardiography to evaluate cardiac structure and function.  2. Essential HTN: Controlled on amlodipine 5 mg. No changes.  3. Hyperlipidemia: On Lipitor 10 mg.  4. Septal infarct: Will arrange for echocardiography to evaluate cardiac structure and function.  Dispo: f/u 6 months.  Kate Sable, M.D., F.A.C.C.

## 2015-12-18 ENCOUNTER — Ambulatory Visit (HOSPITAL_COMMUNITY)
Admission: RE | Admit: 2015-12-18 | Discharge: 2015-12-18 | Disposition: A | Discharge status: Home / Self Care | Payer: Medicare Other | Source: Ambulatory Visit | Attending: Cardiovascular Disease | Admitting: Cardiovascular Disease

## 2015-12-18 DIAGNOSIS — R9431 Abnormal electrocardiogram [ECG] [EKG]: Secondary | ICD-10-CM | POA: Diagnosis not present

## 2015-12-18 DIAGNOSIS — I480 Paroxysmal atrial fibrillation: Secondary | ICD-10-CM | POA: Diagnosis not present

## 2015-12-18 DIAGNOSIS — I517 Cardiomegaly: Secondary | ICD-10-CM | POA: Diagnosis not present

## 2015-12-18 DIAGNOSIS — I4891 Unspecified atrial fibrillation: Secondary | ICD-10-CM | POA: Diagnosis present

## 2015-12-26 ENCOUNTER — Ambulatory Visit (INDEPENDENT_AMBULATORY_CARE_PROVIDER_SITE_OTHER): Payer: Medicare Other | Admitting: *Deleted

## 2015-12-26 DIAGNOSIS — I4891 Unspecified atrial fibrillation: Secondary | ICD-10-CM

## 2015-12-26 NOTE — Progress Notes (Signed)
Pt was started on Eliquis 5mg  bid for atrial fib on 11/29/15.    Reviewed patients medication list.  Pt is not currently on any combined P-gp and strong CYP3A4 inhibitors/inducers (ketoconazole, traconazole, ritonavir, carbamazepine, phenytoin, rifampin, St. John's wort).  Reviewed labs 12/27/15.  SCr 0.96 Weight 76kg  CrCl 68.23.  Dose is appropriate based on age, wt., SCr.   Hgb and HCT: 11.7/38.3  A full discussion of the nature of anticoagulants has been carried out.  A benefit/risk analysis has been presented to the patient, so that they understand the justification for choosing anticoagulation with Eliquis at this time.  The need for compliance is stressed.  Pt is aware to take the medication twice daily.  Side effects of potential bleeding are discussed, including unusual colored urine or stools, coughing up blood or coffee ground emesis, nose bleeds or serious fall or head trauma.  Discussed signs and symptoms of stroke. The patient should avoid any OTC items containing aspirin or ibuprofen.  Avoid alcohol consumption.   Call if any signs of abnormal bleeding.  Discussed financial obligations and resolved any difficulty in obtaining medication.  Next lab test in 6 months at Dr Bronson Ing appt.  Placed in recall.

## 2016-01-07 ENCOUNTER — Telehealth: Payer: Self-pay

## 2016-01-07 NOTE — Telephone Encounter (Signed)
Tried to call pt to update triage. Could not leave a message. Will mail a letter for her to call to update med list.

## 2016-01-09 NOTE — Patient Instructions (Signed)
Sarah Parsons  01/09/2016     @PREFPERIOPPHARMACY @   Your procedure is scheduled on 01/15/16.  Report to [facility] at 8:00 A.M.  Call this number if you have problems the morning of surgery:  [phone]   Remember:  Do not eat food or drink liquids after midnight.  Take these medicines the morning of surgery with A SIP OF WATER Metoprolol   Do not wear jewelry, make-up or nail polish.  Do not wear lotions, powders, or perfumes.  You may wear deodorant.  Do not shave 48 hours prior to surgery.  Men may shave face and neck.  Do not bring valuables to the hospital.  [facility] is not responsible for any belongings or valuables.  Contacts, dentures or bridgework may not be worn into surgery.  Leave your suitcase in the car.  After surgery it may be brought to your room.  For patients admitted to the hospital, discharge time will be determined by your treatment team.  Patients discharged the day of surgery will not be allowed to drive home.   Please read over the following fact sheets that you were given. Anesthesia Post-op Instructions     PATIENT INSTRUCTIONS POST-ANESTHESIA  IMMEDIATELY FOLLOWING SURGERY:  Do not drive or operate machinery for the first twenty four hours after surgery.  Do not make any important decisions for twenty four hours after surgery or while taking narcotic pain medications or sedatives.  If you develop intractable nausea and vomiting or a severe headache please notify your doctor immediately.  FOLLOW-UP:  Please make an appointment with your surgeon as instructed. You do not need to follow up with anesthesia unless specifically instructed to do so.  WOUND CARE INSTRUCTIONS (if applicable):  Keep a dry clean dressing on the anesthesia/puncture wound site if there is drainage.  Once the wound has quit draining you may leave it open to air.  Generally you should leave the bandage intact for twenty four hours unless there is drainage.  If the  epidural site drains for more than 36-48 hours please call the anesthesia department.  QUESTIONS?:  Please feel free to call your physician or the hospital operator if you have any questions, and they will be happy to assist you.      Colonoscopy A colonoscopy is an exam to look at the entire large intestine (colon). This exam can help find problems such as tumors, polyps, inflammation, and areas of bleeding. The exam takes about 1 hour.  LET [facility] CARE PROVIDER KNOW ABOUT:   Any allergies you have.  All medicines you are taking, including vitamins, herbs, eye drops, creams, and over-the-counter medicines.  Previous problems you or members of your family have had with the use of anesthetics.  Any blood disorders you have.  Previous surgeries you have had.  Medical conditions you have. RISKS AND COMPLICATIONS  Generally, this is a safe procedure. However, as with any procedure, complications can occur. Possible complications include:  Bleeding.  Tearing or rupture of the colon wall.  Reaction to medicines given during the exam.  Infection (rare). BEFORE THE PROCEDURE   Ask your health care provider about changing or stopping your regular medicines.  You may be prescribed an oral bowel prep. This involves drinking a large amount of medicated liquid, starting the day before your procedure. The liquid will cause you to have multiple loose stools until your stool is almost clear or light green. This cleans out your colon in preparation for the procedure.  Do not eat or drink anything else once you have started the bowel prep, unless your health care provider tells you it is safe to do so.  Arrange for someone to drive you home after the procedure. PROCEDURE   You will be given medicine to help you relax (sedative).  You will lie on your side with your knees bent.  A long, flexible tube with a light and camera on the end (colonoscope) will be inserted through the rectum  and into the colon. The camera sends video back to a computer screen as it moves through the colon. The colonoscope also releases carbon dioxide gas to inflate the colon. This helps your health care provider see the area better.  During the exam, your health care provider may take a small tissue sample (biopsy) to be examined under a microscope if any abnormalities are found.  The exam is finished when the entire colon has been viewed. AFTER THE PROCEDURE   Do not drive for 24 hours after the exam.  You may have a small amount of blood in your stool.  You may pass moderate amounts of gas and have mild abdominal cramping or bloating. This is caused by the gas used to inflate your colon during the exam.  Ask when your test results will be ready and how you will get your results. Make sure you get your test results.   This information is not intended to replace advice given to you by your health care provider. Make sure you discuss any questions you have with your health care provider.   Document Released: 10/17/2000 Document Revised: 08/10/2013 Document Reviewed: 06/27/2013 Elsevier Interactive Patient Education [employer].

## 2016-01-10 ENCOUNTER — Encounter (HOSPITAL_COMMUNITY)
Admission: RE | Admit: 2016-01-10 | Discharge: 2016-01-10 | Disposition: A | Discharge status: Home / Self Care | Payer: Medicare Other | Source: Ambulatory Visit | Attending: Gastroenterology | Admitting: Gastroenterology

## 2016-01-10 ENCOUNTER — Encounter (HOSPITAL_COMMUNITY): Payer: Self-pay

## 2016-01-10 NOTE — Pre-Procedure Instructions (Signed)
Patient brought for PAT visit and was not aware of procedure or why she was here due to dementia.  Spoke with daughter [name] to verify information and she was aware of plan and verbalized understanding.  States that she will be there day of procedure.  Pre instructions reviewed with her and sent to [facility] for their review.  Nurse [place] notified of plan, as she was unaware of circumstances.  Daughter states that she will be visiting her this afternoon.  Encouraged her to go over instructions for prep and surgery date.  Advised her to contact us or Dr Oneida Alar office with any questions.

## 2016-01-14 ENCOUNTER — Telehealth: Payer: Self-pay

## 2016-01-14 NOTE — Telephone Encounter (Signed)
Olin Hauser from [facility] called and states that pt has not been on a clear liquid diet today and that her procedure needs to be rescheduled.  Pt is set for 01/29/2016 @ 1230pm.  New instructions to be faxed to [facility]. No changes to her medications  Noted.

## 2016-01-15 NOTE — Telephone Encounter (Signed)
REVIEWED-NO ADDITIONAL RECOMMENDATIONS. 

## 2016-01-24 ENCOUNTER — Telehealth: Payer: Self-pay | Admitting: Cardiovascular Disease

## 2016-01-24 NOTE — Telephone Encounter (Signed)
Can hold 2 days prior.

## 2016-01-24 NOTE — Telephone Encounter (Signed)
Does patient need to stop Eliquis prior to colonoscopy that is to be done 01/29/16. / tg

## 2016-01-24 NOTE — Telephone Encounter (Signed)
Will forward to dr Bronson Ing

## 2016-01-24 NOTE — Telephone Encounter (Signed)
Notified day surgery [facility]

## 2016-01-25 ENCOUNTER — Encounter (HOSPITAL_COMMUNITY)
Admission: RE | Admit: 2016-01-25 | Discharge: 2016-01-25 | Disposition: A | Discharge status: Home / Self Care | Payer: Medicare Other | Source: Ambulatory Visit | Attending: Gastroenterology | Admitting: Gastroenterology

## 2016-01-25 ENCOUNTER — Encounter (HOSPITAL_COMMUNITY): Payer: Self-pay

## 2016-01-25 NOTE — Patient Instructions (Signed)
Called [facility] and spoke with patient's nurse, Anabel Halon. She is to call Dr Nona Dell office because they have not received information about prep. She was also instructed to hold Patients eliquis starting Sunday, per Dr Christoper Fabian. Instructed on NPO after midnight except for her metoprolol with a sip of water am of procedure. Spoke with Patients daughter, [name], she is to come sign patient's consent. She verbalized understanding of this.

## 2016-01-25 NOTE — Pre-Procedure Instructions (Signed)
Called [facility] and spoke with patient's nurse, Anabel Halon. She is to call Dr Nona Dell office because they have not received information about prep. She was also instructed to hold Patients eliquis starting Sunday, per Dr Christoper Fabian. Instructed on NPO after midnight except for her metoprolol with a sip of water am of procedure. Spoke with Patients daughter, [name], she is to come sign patient's consent. She verbalized understanding of this.

## 2016-01-28 ENCOUNTER — Telehealth: Payer: Self-pay

## 2016-01-28 NOTE — Telephone Encounter (Signed)
They want to reschedule her but I told them that I would need to talk with [facility] first since this has happened multiped times

## 2016-01-28 NOTE — Telephone Encounter (Signed)
Nursing home called today about needing a prep for her TCS tomorrow. I told her that we sent the instructions and the RX back with her to the nursing home. This is about the 3-4 time that this has happened. She had supper last night and breakfast this morning.

## 2016-01-29 ENCOUNTER — Other Ambulatory Visit: Payer: Self-pay

## 2016-01-29 NOTE — Telephone Encounter (Signed)
Pt has been rescheduled for 02/19/16 @ 8:15. Instructions have been faxed to the nursing home with a Rx for the prep, also her prep-op appointment.

## 2016-01-29 NOTE — Telephone Encounter (Signed)
CALLED TO SPEAK TO DON AND DR. Maudie Mercury. WILL ADDRESS FAILURE TO COMPLETE PREP 3 TIMES SINCE JAN 2017.

## 2016-01-29 NOTE — Telephone Encounter (Signed)
CALLED AND SPOKE TO Sarah Parsons. PT HAS BEEN [name] FOR TCS 3 TIMES. SHE PERSONALLY ASSURED ME THE PT WILL GET A PROPER PREP. SHE APOLOGIZED FOR THE ERRORS. I EXPLAINED TO HER IF THE PT HAS COLON CANCER THEY HAVE DELAYED HER DIAGNOSIS FOR 3 MOS. SHE VOICED HER UNDERSTANDING.SHE HAD NO EXPLANATION FOR THE MIX UP. [place] TCS W/ MAC.

## 2016-02-12 NOTE — Patient Instructions (Signed)
Sarah Parsons  02/12/2016     @PREFPERIOPPHARMACY @   Your procedure is scheduled on  02/19/2016   Report to [facility] at  645 A.M.  Call this number if you have problems the morning of surgery:  [phone]   Remember:  Do not eat food or drink liquids after midnight.  Take these medicines the morning of surgery with A SIP OF WATER  Metoprolol.   Do not wear jewelry, make-up or nail polish.  Do not wear lotions, powders, or perfumes.  You may wear deodorant.  Do not shave 48 hours prior to surgery.  Men may shave face and neck.  Do not bring valuables to the hospital.  [facility] is not responsible for any belongings or valuables.  Contacts, dentures or bridgework may not be worn into surgery.  Leave your suitcase in the car.  After surgery it may be brought to your room.  For patients admitted to the hospital, discharge time will be determined by your treatment team.  Patients discharged the day of surgery will not be allowed to drive home.   Name and phone number of your driver:   [facility] staff Special instructions:  Follow the diet and prep instructions given to you by Dr Nona Dell office. NO Eliquis starting 02/17/2016. Last dose should be 02/16/2016.   Please read over the following fact sheets that you were given. Coughing and Deep Breathing, Surgical Site Infection Prevention, Anesthesia Post-op Instructions and Care and Recovery After Surgery      Colonoscopy A colonoscopy is an exam to look at the entire large intestine (colon). This exam can help find problems such as tumors, polyps, inflammation, and areas of bleeding. The exam takes about 1 hour.  LET [facility] CARE PROVIDER KNOW ABOUT:   Any allergies you have.  All medicines you are taking, including vitamins, herbs, eye drops, creams, and over-the-counter medicines.  Previous problems you or members of your family have had with the use of anesthetics.  Any blood disorders you  have.  Previous surgeries you have had.  Medical conditions you have. RISKS AND COMPLICATIONS  Generally, this is a safe procedure. However, as with any procedure, complications can occur. Possible complications include:  Bleeding.  Tearing or rupture of the colon wall.  Reaction to medicines given during the exam.  Infection (rare). BEFORE THE PROCEDURE   Ask your health care provider about changing or stopping your regular medicines.  You may be prescribed an oral bowel prep. This involves drinking a large amount of medicated liquid, starting the day before your procedure. The liquid will cause you to have multiple loose stools until your stool is almost clear or light green. This cleans out your colon in preparation for the procedure.  Do not eat or drink anything else once you have started the bowel prep, unless your health care provider tells you it is safe to do so.  Arrange for someone to drive you home after the procedure. PROCEDURE   You will be given medicine to help you relax (sedative).  You will lie on your side with your knees bent.  A long, flexible tube with a light and camera on the end (colonoscope) will be inserted through the rectum and into the colon. The camera sends video back to a computer screen as it moves through the colon. The colonoscope also releases carbon dioxide gas to inflate the colon. This helps your health care provider see the area better.  During the exam, your health care provider may take a small tissue sample (biopsy) to be examined under a microscope if any abnormalities are found.  The exam is finished when the entire colon has been viewed. AFTER THE PROCEDURE   Do not drive for 24 hours after the exam.  You may have a small amount of blood in your stool.  You may pass moderate amounts of gas and have mild abdominal cramping or bloating. This is caused by the gas used to inflate your colon during the exam.  Ask when your test  results will be ready and how you will get your results. Make sure you get your test results.   This information is not intended to replace advice given to you by your health care provider. Make sure you discuss any questions you have with your health care provider.   Document Released: 10/17/2000 Document Revised: 08/10/2013 Document Reviewed: 06/27/2013 Elsevier Interactive Patient Education 2016 [employer]vier [employer] Colonoscopy, Care After Refer to this sheet in the next few weeks. These instructions provide you with information on caring for yourself after your procedure. Your health care provider may also give you more specific instructions. Your treatment has been planned according to current medical practices, but problems sometimes occur. Call your health care provider if you have any problems or questions after your procedure. WHAT TO EXPECT AFTER THE PROCEDURE  After your procedure, it is typical to have the following:  A small amount of blood in your stool.  Moderate amounts of gas and mild abdominal cramping or bloating. HOME CARE INSTRUCTIONS  Do not drive, operate machinery, or sign important documents for 24 hours.  You may shower and resume your regular physical activities, but move at a slower pace for the first 24 hours.  Take frequent rest periods for the first 24 hours.  Walk around or put a warm pack on your abdomen to help reduce abdominal cramping and bloating.  Drink enough fluids to keep your urine clear or pale yellow.  You may resume your normal diet as instructed by your health care provider. Avoid heavy or fried foods that are hard to digest.  Avoid drinking alcohol for 24 hours or as instructed by your health care provider.  Only take over-the-counter or prescription medicines as directed by your health care provider.  If a tissue sample (biopsy) was taken during your procedure:  Do not take aspirin or blood thinners for 7 days, or as instructed by your  health care provider.  Do not drink alcohol for 7 days, or as instructed by your health care provider.  Eat soft foods for the first 24 hours. SEEK MEDICAL CARE IF: You have persistent spotting of blood in your stool 2-3 days after the procedure. SEEK IMMEDIATE MEDICAL CARE IF:  You have more than a small spotting of blood in your stool.  You pass large blood clots in your stool.  Your abdomen is swollen (distended).  You have nausea or vomiting.  You have a fever.  You have increasing abdominal pain that is not relieved with medicine.   This information is not intended to replace advice given to you by your health care provider. Make sure you discuss any questions you have with your health care provider.   Document Released: 06/03/2004 Document Revised: 08/10/2013 Document Reviewed: 06/27/2013 Elsevier Interactive Patient Education 2016 Elsevier Inc[employer] PATIENT INSTRUCTIONS POST-ANESTHESIA  IMMEDIATELY FOLLOWING SURGERY:  Do not drive or operate machinery for the first twenty four hours after surgery.  Do  not make any important decisions for twenty four hours after surgery or while taking narcotic pain medications or sedatives.  If you develop intractable nausea and vomiting or a severe headache please notify your doctor immediately.  FOLLOW-UP:  Please make an appointment with your surgeon as instructed. You do not need to follow up with anesthesia unless specifically instructed to do so.  WOUND CARE INSTRUCTIONS (if applicable):  Keep a dry clean dressing on the anesthesia/puncture wound site if there is drainage.  Once the wound has quit draining you may leave it open to air.  Generally you should leave the bandage intact for twenty four hours unless there is drainage.  If the epidural site drains for more than 36-48 hours please call the anesthesia department.  QUESTIONS?:  Please feel free to call your physician or the hospital operator if you have any questions, and they will  be happy to assist you.

## 2016-02-13 ENCOUNTER — Encounter (HOSPITAL_COMMUNITY)
Admission: RE | Admit: 2016-02-13 | Discharge: 2016-02-13 | Disposition: A | Discharge status: Home / Self Care | Payer: Medicare Other | Source: Ambulatory Visit | Attending: Gastroenterology | Admitting: Gastroenterology

## 2016-02-13 DIAGNOSIS — Z01812 Encounter for preprocedural laboratory examination: Secondary | ICD-10-CM | POA: Diagnosis not present

## 2016-02-13 LAB — CBC WITH DIFFERENTIAL/PLATELET
BASOS ABS: 0 10*3/uL (ref 0.0–0.1)
BASOS PCT: 0 %
EOS ABS: 0.3 10*3/uL (ref 0.0–0.7)
Eosinophils Relative: 4 %
HCT: 40 % (ref 36.0–46.0)
Hemoglobin: 12.9 g/dL (ref 12.0–15.0)
Lymphocytes Relative: 31 %
Lymphs Abs: 2.3 10*3/uL (ref 0.7–4.0)
MCH: 29.9 pg (ref 26.0–34.0)
MCHC: 32.3 g/dL (ref 30.0–36.0)
MCV: 92.8 fL (ref 78.0–100.0)
MONO ABS: 0.4 10*3/uL (ref 0.1–1.0)
MONOS PCT: 5 %
NEUTROS ABS: 4.4 10*3/uL (ref 1.7–7.7)
NEUTROS PCT: 60 %
PLATELETS: 237 10*3/uL (ref 150–400)
RBC: 4.31 MIL/uL (ref 3.87–5.11)
RDW: 12.1 % (ref 11.5–15.5)
WBC: 7.4 10*3/uL (ref 4.0–10.5)

## 2016-02-13 LAB — BASIC METABOLIC PANEL
Anion gap: 7 (ref 5–15)
BUN: 18 mg/dL (ref 6–20)
CHLORIDE: 107 mmol/L (ref 101–111)
CO2: 28 mmol/L (ref 22–32)
Calcium: 9.1 mg/dL (ref 8.9–10.3)
Creatinine, Ser: 0.96 mg/dL (ref 0.44–1.00)
GFR, EST NON AFRICAN AMERICAN: 60 mL/min — AB (ref >60)
Glucose, Bld: 86 mg/dL (ref 65–99)
POTASSIUM: 4 mmol/L (ref 3.5–5.1)
SODIUM: 142 mmol/L (ref 135–145)

## 2016-02-19 ENCOUNTER — Ambulatory Visit (HOSPITAL_COMMUNITY): Payer: Medicare Other | Admitting: Anesthesiology

## 2016-02-19 ENCOUNTER — Encounter (HOSPITAL_COMMUNITY): Payer: Self-pay | Admitting: *Deleted

## 2016-02-19 ENCOUNTER — Encounter (HOSPITAL_COMMUNITY)
Admission: RE | Disposition: A | Discharge status: Home / Self Care | Payer: Self-pay | Source: Ambulatory Visit | Attending: Gastroenterology

## 2016-02-19 ENCOUNTER — Ambulatory Visit (HOSPITAL_COMMUNITY)
Admission: RE | Admit: 2016-02-19 | Discharge: 2016-02-19 | Disposition: A | Discharge status: Home / Self Care | Payer: Medicare Other | Source: Ambulatory Visit | Attending: Gastroenterology | Admitting: Gastroenterology

## 2016-02-19 DIAGNOSIS — Z87891 Personal history of nicotine dependence: Secondary | ICD-10-CM | POA: Insufficient documentation

## 2016-02-19 DIAGNOSIS — K648 Other hemorrhoids: Secondary | ICD-10-CM | POA: Diagnosis not present

## 2016-02-19 DIAGNOSIS — K5730 Diverticulosis of large intestine without perforation or abscess without bleeding: Secondary | ICD-10-CM | POA: Insufficient documentation

## 2016-02-19 DIAGNOSIS — Z79899 Other long term (current) drug therapy: Secondary | ICD-10-CM | POA: Insufficient documentation

## 2016-02-19 DIAGNOSIS — K921 Melena: Secondary | ICD-10-CM | POA: Insufficient documentation

## 2016-02-19 DIAGNOSIS — I10 Essential (primary) hypertension: Secondary | ICD-10-CM | POA: Insufficient documentation

## 2016-02-19 DIAGNOSIS — E7800 Pure hypercholesterolemia, unspecified: Secondary | ICD-10-CM | POA: Diagnosis not present

## 2016-02-19 DIAGNOSIS — Z7901 Long term (current) use of anticoagulants: Secondary | ICD-10-CM | POA: Insufficient documentation

## 2016-02-19 DIAGNOSIS — Q438 Other specified congenital malformations of intestine: Secondary | ICD-10-CM | POA: Insufficient documentation

## 2016-02-19 HISTORY — PX: COLONOSCOPY WITH PROPOFOL: SHX5780

## 2016-02-19 SURGERY — COLONOSCOPY WITH PROPOFOL
Anesthesia: Monitor Anesthesia Care

## 2016-02-19 MED ORDER — FENTANYL CITRATE (PF) 100 MCG/2ML IJ SOLN: 25.0000 ug | INTRAMUSCULAR | Status: DC | PRN

## 2016-02-19 MED ORDER — SUCCINYLCHOLINE CHLORIDE 20 MG/ML IJ SOLN
INTRAMUSCULAR | Status: AC
  Filled 2016-02-19: qty 1

## 2016-02-19 MED ORDER — MIDAZOLAM HCL 2 MG/2ML IJ SOLN
INTRAMUSCULAR | Status: AC
  Filled 2016-02-19: qty 2

## 2016-02-19 MED ORDER — PROPOFOL 10 MG/ML IV BOLUS
INTRAVENOUS | Status: AC
  Filled 2016-02-19: qty 40

## 2016-02-19 MED ORDER — MIDAZOLAM HCL 2 MG/2ML IJ SOLN
1.0000 mg | INTRAMUSCULAR | Status: DC | PRN
  Administered 2016-02-19 (×2): 1 mg via INTRAVENOUS

## 2016-02-19 MED ORDER — LACTATED RINGERS IV SOLN
INTRAVENOUS | Status: DC
  Administered 2016-02-19: 1000 mL via INTRAVENOUS

## 2016-02-19 MED ORDER — ONDANSETRON HCL 4 MG/2ML IJ SOLN: 4.0000 mg | Freq: Once | INTRAMUSCULAR | Status: DC | PRN

## 2016-02-19 MED ORDER — PROPOFOL 500 MG/50ML IV EMUL
INTRAVENOUS | Status: DC | PRN
  Administered 2016-02-19: 125 ug/kg/min via INTRAVENOUS

## 2016-02-19 MED ORDER — LIDOCAINE HCL (PF) 1 % IJ SOLN
INTRAMUSCULAR | Status: AC
  Filled 2016-02-19: qty 5

## 2016-02-19 MED ORDER — MIDAZOLAM HCL 5 MG/5ML IJ SOLN
INTRAMUSCULAR | Status: DC | PRN
  Administered 2016-02-19 (×2): 1 mg via INTRAVENOUS

## 2016-02-19 NOTE — Progress Notes (Signed)
Patient poor historian,transported to facility by [facility].  Nursing home personnel called to confirm medication and prep results. Daughter - Sarah Parsons in to sign patients consent and will return when she has taken children to school.

## 2016-02-19 NOTE — Discharge Instructions (Signed)
You have small internal hemorrhoids, which can cause rectal bleeding, and diverticulosis IN YOUR RIGHT AND LEFT COLON.   RESUME ELIQUIS TODAY.  FOLLOW A HIGH FIBER DIET. AVOID ITEMS THAT CAUSE BLOATING & GAS. SEE INFO BELOW.  NEXT COLONOSCOPY IN 10 YEARS.  Colonoscopy Care After Read the instructions outlined below and refer to this sheet in the next week. These discharge instructions provide you with general information on caring for yourself after you leave the hospital. While your treatment has been planned according to the most current medical practices available, unavoidable complications occasionally occur. If you have any problems or questions after discharge, call DR. Donal Lynam, [phone].  ACTIVITY  You may resume your regular activity, but move at a slower pace for the next 24 hours.   Take frequent rest periods for the next 24 hours.   Walking will help get rid of the air and reduce the bloated feeling in your belly (abdomen).   No driving for 24 hours (because of the medicine (anesthesia) used during the test).   You may shower.   Do not sign any important legal documents or operate any machinery for 24 hours (because of the anesthesia used during the test).    NUTRITION  Drink plenty of fluids.   You may resume your normal diet as instructed by your doctor.   Begin with a light meal and progress to your normal diet. Heavy or fried foods are harder to digest and may make you feel sick to your stomach (nauseated).   Avoid alcoholic beverages for 24 hours or as instructed.    MEDICATIONS  You may resume your normal medications.   WHAT YOU CAN EXPECT TODAY  Some feelings of bloating in the abdomen.   Passage of more gas than usual.   Spotting of blood in your stool or on the toilet paper  .  IF YOU HAD POLYPS REMOVED DURING THE COLONOSCOPY:  Eat a soft diet IF YOU HAVE NAUSEA, BLOATING, ABDOMINAL PAIN, OR VOMITING.    FINDING OUT THE RESULTS OF YOUR  TEST Not all test results are available during your visit. DR. Oneida Alar WILL CALL YOU WITHIN 7 DAYS OF YOUR PROCEDUE WITH YOUR RESULTS. Do not assume everything is normal if you have not heard from DR. Corretta Munce IN ONE WEEK, CALL HER OFFICE AT [phone].  SEEK IMMEDIATE MEDICAL ATTENTION AND CALL THE OFFICE: [phone] IF:  You have more than a spotting of blood in your stool.   Your belly is swollen (abdominal distention).   You are nauseated or vomiting.   You have a temperature over 101F.   You have abdominal pain or discomfort that is severe or gets worse throughout the day.  High-Fiber Diet A high-fiber diet changes your normal diet to include more whole grains, legumes, fruits, and vegetables. Changes in the diet involve replacing refined carbohydrates with unrefined foods. The calorie level of the diet is essentially unchanged. The Dietary Reference Intake (recommended amount) for adult males is 38 grams per day. For adult females, it is 25 grams per day. Pregnant and lactating women should consume 28 grams of fiber per day. Fiber is the intact part of a plant that is not broken down during digestion. Functional fiber is fiber that has been isolated from the plant to provide a beneficial effect in the body. PURPOSE  Increase stool bulk.   Ease and regulate bowel movements.   Lower cholesterol.  REDUCE RISK OF COLON CANCER  INDICATIONS THAT YOU NEED MORE FIBER  Constipation  and hemorrhoids.   Uncomplicated diverticulosis (intestine condition) and irritable bowel syndrome.   Weight management.   As a protective measure against hardening of the arteries (atherosclerosis), diabetes, and cancer.   GUIDELINES FOR INCREASING FIBER IN THE DIET  Start adding fiber to the diet slowly. A gradual increase of about 5 more grams (2 slices of whole-wheat bread, 2 servings of most fruits or vegetables, or 1 bowl of high-fiber cereal) per day is best. Too rapid an increase in fiber may  result in constipation, flatulence, and bloating.   Drink enough water and fluids to keep your urine clear or pale yellow. Water, juice, or caffeine-free drinks are recommended. Not drinking enough fluid may cause constipation.   Eat a variety of high-fiber foods rather than one type of fiber.   Try to increase your intake of fiber through using high-fiber foods rather than fiber pills or supplements that contain small amounts of fiber.   The goal is to change the types of food eaten. Do not supplement your present diet with high-fiber foods, but replace foods in your present diet.   INCLUDE A VARIETY OF FIBER SOURCES  Replace refined and processed grains with whole grains, canned fruits with fresh fruits, and incorporate other fiber sources. White rice, white breads, and most bakery goods contain little or no fiber.   Brown whole-grain rice, buckwheat oats, and many fruits and vegetables are all good sources of fiber. These include: broccoli, Brussels sprouts, cabbage, cauliflower, beets, sweet potatoes, white potatoes (skin on), carrots, tomatoes, eggplant, squash, berries, fresh fruits, and dried fruits.   Cereals appear to be the richest source of fiber. Cereal fiber is found in whole grains and bran. Bran is the fiber-rich outer coat of cereal grain, which is largely removed in refining. In whole-grain cereals, the bran remains. In breakfast cereals, the largest amount of fiber is found in those with "bran" in their names. The fiber content is sometimes indicated on the label.   You may need to include additional fruits and vegetables each day.   In baking, for 1 cup white flour, you may use the following substitutions:   1 cup whole-wheat flour minus 2 tablespoons.   1/2 cup white flour plus 1/2 cup whole-wheat flour.   Diverticulosis Diverticulosis is a common condition that develops when small pouches (diverticula) form in the wall of the colon. The risk of diverticulosis increases  with age. It happens more often in people who eat a low-fiber diet. Most individuals with diverticulosis have no symptoms. Those individuals with symptoms usually experience belly (abdominal) pain, constipation, or loose stools (diarrhea).  HOME CARE INSTRUCTIONS  Increase the amount of fiber in your diet as directed by your caregiver or dietician. This may reduce symptoms of diverticulosis.   Drink at least 6 to 8 glasses of water each day to prevent constipation.   Try not to strain when you have a bowel movement.   Avoiding nuts and seeds to prevent complications is NOT NECESSARY.     FOODS HAVING HIGH FIBER CONTENT INCLUDE:  Fruits. Apple, peach, pear, tangerine, raisins, prunes.   Vegetables. Brussels sprouts, asparagus, broccoli, cabbage, carrot, cauliflower, romaine lettuce, spinach, summer squash, tomato, winter squash, zucchini.   Starchy Vegetables. Baked beans, kidney beans, lima beans, split peas, lentils, potatoes (with skin).   Grains. Whole wheat bread, brown rice, bran flake cereal, plain oatmeal, white rice, shredded wheat, bran muffins.    SEEK IMMEDIATE MEDICAL CARE IF:  You develop increasing pain or severe bloating.  You have an oral temperature above 101F.   You develop vomiting or bowel movements that are bloody or black.   Hemorrhoids Hemorrhoids are dilated (enlarged) veins around the rectum. Sometimes clots will form in the veins. This makes them swollen and painful. These are called thrombosed hemorrhoids. Causes of hemorrhoids include:  Constipation.   Straining to have a bowel movement.   HEAVY LIFTING  HOME CARE INSTRUCTIONS  Eat a well balanced diet and drink 6 to 8 glasses of water every day to avoid constipation. You may also use a bulk laxative.   Avoid straining to have bowel movements.   Keep anal area dry and clean.   Do not use a donut shaped pillow or sit on the toilet for long periods. This increases blood pooling and pain.     Move your bowels when your body has the urge; this will require less straining and will decrease pain and pressure.

## 2016-02-19 NOTE — Anesthesia Postprocedure Evaluation (Signed)
Anesthesia Post Note  Patient: Sarah Parsons  Procedure(s) Performed: Procedure(s) (LRB): COLONOSCOPY WITH PROPOFOL (N/A)  Patient location during evaluation: Nursing Unit Anesthesia Type: MAC Level of consciousness: awake and oriented Pain management: pain level controlled Vital Signs Assessment: post-procedure vital signs reviewed and stable Respiratory status: spontaneous breathing and patient connected to face mask oxygen Cardiovascular status: stable Postop Assessment: no signs of nausea or vomiting Anesthetic complications: no    Last Vitals:  Filed Vitals:   02/19/16 0815 02/19/16 0820  BP: 137/79 146/65  Pulse:    Temp:    Resp: 19 19    Last Pain: There were no vitals filed for this visit.               ADAMS, AMY A

## 2016-02-19 NOTE — H&P (Signed)
Primary Care Physician:  Jani Gravel, MD Primary Gastroenterologist:  Dr. Oneida Alar  Pre-Procedure History & Physical: HPI:  Sarah Parsons is a 70 y.o. female here for  BRBPR-FIRST TCS.  Past Medical History  Diagnosis Date  . Tobacco abuse   . UTI (urinary tract infection) 11/17/2012  . Cholelithiasis 11/17/2012  . Fatty liver 11/17/2012  . 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 FTT (failure to thrive) in adult 11/16/2012  . Hypertension   . Alcoholism /alcohol abuse ([place])   . 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 Anemia   . Hypercholesteremia     History reviewed. No pertinent past surgical history.  Prior to Admission medications   Medication Sig Start Date End Date Taking? Authorizing Provider  acetaminophen (TYLENOL) 500 MG tablet Take 500 mg by mouth 2 (two) times daily.   Yes Historical Provider, MD  Amino Acids-Protein Hydrolys (FEEDING SUPPLEMENT, PRO-STAT SUGAR FREE 64,) LIQD Take 30 mLs by mouth 2 (two) times daily.   Yes Historical Provider, MD  apixaban (ELIQUIS) 5 MG TABS tablet Take 1 tablet (5 mg total) by mouth 2 (two) times daily. 11/29/15  Yes Herminio Commons, MD  atorvastatin (LIPITOR) 10 MG tablet Take 10 mg by mouth daily at 6 PM.  10/01/15  Yes Historical Provider, MD  Fish Oil OIL Take 1,000 mg by mouth 2 (two) times daily.   Yes Historical Provider, MD  metoprolol succinate (TOPROL XL) 25 MG 24 hr tablet Take 1 tablet (25 mg total) by mouth 2 (two) times daily. 11/29/15  Yes Herminio Commons, MD  Multiple Vitamins-Minerals (THERA M PLUS PO) Take 1 tablet by mouth daily.   Yes Historical Provider, MD  potassium chloride SA (K-DUR,KLOR-CON) 20 MEQ tablet Take 1 tablet (20 mEq total) by mouth 2 (two) times daily. 07/03/13  Yes Rolland Porter, MD  thiamine 100 MG tablet Take 1 tablet (100 mg total) by mouth daily. 11/17/12  Yes Rexene Alberts, MD  Vitamin D, Ergocalciferol, (DRISDOL) 50000 units CAPS capsule Take 50,000 Units  by mouth every 7 (seven) days.    Historical Provider, MD    Allergies as of 12/05/2015  . (No Known Allergies)    Family History  Problem Relation Age of Onset  . Colon cancer Neg Hx     Social History   Social History  . Marital Status: Married    Spouse Name: Clinical research associate Sr.   . Number of Children: 2  . Years of Education: 12    Occupational History  . Not on file.   Social History Main Topics  . Smoking status: Former Smoker -- 1.00 packs/day for 40 years    Types: Cigarettes    Start date: 06/26/1960    Quit date: 06/28/2013  . Smokeless tobacco: Never Used  . Alcohol Use: No     Comment: Not currently; Last ETOH 10/2014, previously 1/2 pint daily for 8-10 years.  . Drug Use: No  . Sexual Activity: Not on file   Other Topics Concern  . Not on file   Social History Narrative   Born: Therapist, music   Occupation: ?   Children:  2 ,  Sarah Parsons (daughter), and Sarah Parsons.  (son)   Hobbies:  none    Review of Systems: See HPI, otherwise negative ROS   Physical Exam: BP 144/75 mmHg  Pulse 68  Temp(Src) 97.7 F (36.5 C) (Oral)  Resp 20  Ht 5\' 7"  (1.702 m)  Wt 168 lb (76.204 kg)  BMI 26.31 kg/m2  SpO2 99% General:   Alert,  pleasant and cooperative in NAD Head:  Normocephalic and atraumatic. Neck:  Supple; Lungs:  Clear throughout to auscultation.    Heart:  Regular rate and rhythm. Abdomen:  Soft, nontender and nondistended. Normal bowel sounds, without guarding, and without rebound.   Neurologic:  Alert and  oriented x4;  grossly normal neurologically.  Impression/Plan:    BRBPR  PLAN: TCS TODAY

## 2016-02-19 NOTE — Progress Notes (Signed)
Called [facility] to give report. Nurse not available to take report. Highlighted areas on discharge papers about findings. Patients' daughter aware of discharge instructions. Patient discharged to [facility] in stable condition. Patient transported via [facility] transportation.

## 2016-02-19 NOTE — Transfer of Care (Signed)
Immediate Anesthesia Transfer of Care Note  Patient: Sarah Parsons  Procedure(s) Performed: Procedure(s) with comments: COLONOSCOPY WITH PROPOFOL (N/A) - 0930 - moved to 4/18 @ 9:00 - office notified pt  Patient Location: PACU  Anesthesia Type:MAC  Level of Consciousness: sedated and patient cooperative  Airway & Oxygen Therapy: Patient Spontanous Breathing and Patient connected to face mask oxygen  Post-op Assessment: Report given to RN and Post -op Vital signs reviewed and stable  Post vital signs: Reviewed and stable  Last Vitals:  Filed Vitals:   02/19/16 0815 02/19/16 0820  BP: 137/79 146/65  Pulse:    Temp:    Resp: 19 19    Complications: No apparent anesthesia complications

## 2016-02-19 NOTE — Anesthesia Procedure Notes (Signed)
Procedure Name: MAC Date/Time: 02/19/2016 8:25 AM Performed by: Andree Elk, Yader Criger A Pre-anesthesia Checklist: Patient identified, Emergency Drugs available, Patient being monitored, Suction available and Timeout performed Patient Re-evaluated:Patient Re-evaluated prior to inductionOxygen Delivery Method: Simple face mask

## 2016-02-19 NOTE — Anesthesia Preprocedure Evaluation (Signed)
Anesthesia Evaluation  Patient identified by MRN, date of birth, ID band Patient confused    Reviewed: Allergy & Precautions, NPO status , Patient's Chart, lab work & pertinent test results, reviewed documented beta blocker date and time , Unable to perform ROS - Chart review only  Airway Mallampati: II  TM Distance: >3 FB     Dental  (+) Missing, Poor Dentition, Chipped,    Pulmonary former smoker,    Pulmonary exam normal        Cardiovascular hypertension, Pt. on medications and Pt. on home beta blockers Normal cardiovascular exam     Neuro/Psych    GI/Hepatic (+)     substance abuse  alcohol use, Hepatitis -, Unspecified  Endo/Other    Renal/GU      Musculoskeletal   Abdominal Normal abdominal exam  (+)   Peds  Hematology  (+) anemia ,   Anesthesia Other Findings   Reproductive/Obstetrics                             Anesthesia Physical Anesthesia Plan  ASA: IV  Anesthesia Plan: MAC   Post-op Pain Management:    Induction: Intravenous  Airway Management Planned: Nasal Cannula  Additional Equipment:   Intra-op Plan:   Post-operative Plan:   Informed Consent:   History available from chart only  Plan Discussed with: CRNA  Anesthesia Plan Comments:         Anesthesia Quick Evaluation

## 2016-02-21 NOTE — Op Note (Signed)
[facility] Patient Name: Sarah Parsons Procedure Date: 02/19/2016 8:24 AM MRN: [id number] Date of Birth: 1946-09-01 Attending MD: Barney Drain , MD CSN: [id number] Age: 70 Admit Type: Outpatient Procedure:                Colonoscopy Indications:              Hematochezia Providers:                Barney Drain, MD, Lurline Del, RN, Georgeann Oppenheim,                            Technician Referring MD:             Teodora Medici. Kim Medicines:                Propofol per Anesthesia Complications:            No immediate complications. Estimated Blood Loss:     Estimated blood loss was minimal. Procedure:                Pre-Anesthesia Assessment:                           - Prior to the procedure, a History and Physical                            was performed, and patient medications and                            allergies were reviewed. The patient's tolerance of                            previous anesthesia was also reviewed. The risks                            and benefits of the procedure and the sedation                            options and risks were discussed with the patient.                            All questions were answered, and informed consent                            was obtained. Prior Anticoagulants: The patient has                            taken Eliquis (apixaban), last dose was 2 days                            prior to procedure. ASA Grade Assessment: II - A                            patient with mild systemic disease. After reviewing  the risks and benefits, the patient was deemed in                            satisfactory condition to undergo the procedure.                           After obtaining informed consent, the colonoscope                            was passed under direct vision. Throughout the                            procedure, the patient's blood pressure, pulse, and                            oxygen  saturations were monitored continuously. The                            EC-[id number] scope was introduced through                            the anus and advanced to the the terminal ileum.                            The ileocecal valve, appendiceal orifice, and                            rectum were photographed. The colonoscopy was                            performed without difficulty. The patient tolerated                            the procedure well. The quality of the bowel                            preparation was excellent. Scope In: 8:40:02 AM Scope Out: 8:57:16 AM Scope Withdrawal Time: 0 hours 7 minutes 52 seconds  Total Procedure Duration: 0 hours 17 minutes 14 seconds  Findings:      The perianal examination was normal.      The sigmoid colon was mildly redundant. Advancing the scope required       using manual pressure.      A few small-mouthed diverticula were found in the sigmoid colon,       descending colon and ascending colon.      Non-bleeding internal hemorrhoids were found. The hemorrhoids were small. Impression:               - Redundant colon.                           - Diverticulosis in the sigmoid colon, in the                            descending colon and in the ascending colon.                           -  Non-bleeding internal hemorrhoids.                           - No specimens collected. Moderate Sedation:      Moderate (conscious) sedation was personally administered by an       anesthesia professional. The following parameters were monitored: oxygen       saturation, heart rate, blood pressure, and response to care. Recommendation:           - Patient has a contact number available for                            emergencies. The signs and symptoms of potential                            delayed complications were discussed with the                            patient. Return to normal activities tomorrow.                            Written  discharge instructions were provided to the                            patient.                           - High fiber diet.                           - Continue present medications.                           - Repeat colonoscopy in 10 years for surveillance. Procedure Code(s):        --- Professional ---                           [phone], Colonoscopy, flexible; diagnostic, including                            collection of specimen(s) by brushing or washing,                            when performed (separate procedure) Diagnosis Code(s):        --- Professional ---                           [id number], Other hemorrhoids                           K92.1, Melena (includes Hematochezia)                           K57.30, Diverticulosis of large intestine without                            perforation or abscess without bleeding  Q43.8, Other specified congenital malformations of                            intestine CPT copyright 2016 American Medical Association. All rights reserved. The codes documented in this report are preliminary and upon coder review may  be revised to meet current compliance requirements. Barney Drain, MD Barney Drain, MD 02/21/2016 2:05:15 PM This report has been signed electronically. Number of Addenda: 0

## 2016-02-25 ENCOUNTER — Encounter (HOSPITAL_COMMUNITY): Payer: Self-pay | Admitting: Gastroenterology

## 2016-06-16 ENCOUNTER — Ambulatory Visit: Payer: Medicare Other | Admitting: Cardiovascular Disease

## 2016-06-18 ENCOUNTER — Ambulatory Visit: Payer: Medicare Other | Admitting: Cardiovascular Disease

## 2016-06-18 ENCOUNTER — Ambulatory Visit (INDEPENDENT_AMBULATORY_CARE_PROVIDER_SITE_OTHER): Payer: Medicare Other | Admitting: *Deleted

## 2016-06-18 DIAGNOSIS — I480 Paroxysmal atrial fibrillation: Secondary | ICD-10-CM

## 2016-06-18 NOTE — Progress Notes (Addendum)
Pt was started on Eliquis 5mg  bid for atrial fib on 11/29/15.    Pt denies any bleeding.  No obvious bruising.    Reviewed patients medication list.  Pt is not currently on any combined P-gp and strong CYP3A4 inhibitors/inducers (ketoconazole, traconazole, ritonavir, carbamazepine, phenytoin, rifampin, St. John's wort).  Reviewed labs 06/19/16 @ [facility].  SCr 0.96    Weight 76kg  CrCl 67.29.  Dose is appropriate based on age, wt., SCr.   Hgb and HCT: 12.9/40.0  A full discussion of the nature of anticoagulants has been carried out.  A benefit/risk analysis has been presented to the patient, so that they understand the justification for choosing anticoagulation with Eliquis at this time.  The need for compliance is stressed.  Pt is aware to take the medication twice daily.  Side effects of potential bleeding are discussed, including unusual colored urine or stools, coughing up blood or coffee ground emesis, nose bleeds or serious fall or head trauma.  Discussed signs and symptoms of stroke. The patient should avoid any OTC items containing aspirin or ibuprofen.  Avoid alcohol consumption.   Call if any signs of abnormal bleeding.  Discussed financial obligations and resolved any difficulty in obtaining medication.  Next lab test in 6 months.    Reviewed labs/Placed in recall.

## 2016-07-01 ENCOUNTER — Encounter: Payer: Self-pay | Admitting: Cardiology

## 2016-07-01 ENCOUNTER — Ambulatory Visit (INDEPENDENT_AMBULATORY_CARE_PROVIDER_SITE_OTHER): Payer: Medicare Other | Admitting: Cardiology

## 2016-07-01 DIAGNOSIS — F1097 Alcohol use, unspecified with alcohol-induced persisting dementia: Secondary | ICD-10-CM | POA: Diagnosis not present

## 2016-07-01 DIAGNOSIS — F1027 Alcohol dependence with alcohol-induced persisting dementia: Secondary | ICD-10-CM

## 2016-07-01 DIAGNOSIS — I480 Paroxysmal atrial fibrillation: Secondary | ICD-10-CM | POA: Insufficient documentation

## 2016-07-01 DIAGNOSIS — Z7901 Long term (current) use of anticoagulants: Secondary | ICD-10-CM | POA: Insufficient documentation

## 2016-07-01 NOTE — Assessment & Plan Note (Signed)
Pt is a resident of [facility] skilled [facility]

## 2016-07-01 NOTE — Patient Instructions (Signed)

## 2016-07-01 NOTE — Assessment & Plan Note (Signed)
Eliquis 

## 2016-07-01 NOTE — Progress Notes (Signed)
07/01/2016 Sarah Parsons   Oct 28, 1946  [id number]  Primary Physician Sarah Gravel, MD Primary Cardiologist: Dr Sarah Parsons  HPI:  70 year old [place] female who was referred for an abnormal ECG in Jan 2017. She has a history of hypertension, hyperlipidemia, and dementia. She is a resident of a skilled nursing facility. When Dr Sarah Parsons saw her she had PAF (asympptomatic). ECG which demonstrated rapid atrial fibrillation, heart rate 136 bpm, with septal infarct noted. Echo done Feb 2017 showed her EF to be 65-70%, no WMA, and normal LA size. She is CHADs VASc-3 and was placed on Eliquis. She is in the office today for a check up. She denies any palpations. Her attendant reports no problems since we saw her last in Feb 2017. She actually is in NSR on exam today in the office.     Current Outpatient Prescriptions  Medication Sig Dispense Refill  . acetaminophen (TYLENOL) 500 MG tablet Take 500 mg by mouth 2 (two) times daily.    . Amino Acids-Protein Hydrolys (FEEDING SUPPLEMENT, PRO-STAT SUGAR FREE 64,) LIQD Take 30 mLs by mouth 2 (two) times daily.    Marland Kitchen apixaban (ELIQUIS) 5 MG TABS tablet Take 1 tablet (5 mg total) by mouth 2 (two) times daily.    Marland Kitchen atorvastatin (LIPITOR) 10 MG tablet Take 10 mg by mouth daily at 6 PM.     . Fish Oil OIL Take 1,000 mg by mouth 2 (two) times daily.    . metoprolol succinate (TOPROL XL) 25 MG 24 hr tablet Take 1 tablet (25 mg total) by mouth 2 (two) times daily.    . Multiple Vitamins-Minerals (THERA M PLUS PO) Take 1 tablet by mouth daily.    . potassium chloride SA (K-DUR,KLOR-CON) 20 MEQ tablet Take 1 tablet (20 mEq total) by mouth 2 (two) times daily. 8 tablet 0  . thiamine 100 MG tablet Take 1 tablet (100 mg total) by mouth daily.    . Vitamin D, Ergocalciferol, (DRISDOL) 50000 units CAPS capsule Take 50,000 Units by mouth every 7 (seven) days.     No current facility-administered medications for this visit.     No Known Allergies  Social History     Social History  . Marital status: Married    Spouse name: Clinical research associate Sr.   . Number of children: 2  . Years of education: 12    Occupational History  . Not on file.   Social History Main Topics  . Smoking status: Former Smoker    Packs/day: 1.00    Years: 40.00    Types: Cigarettes    Start date: 06/26/1960    Quit date: 06/28/2013  . Smokeless tobacco: Never Used  . Alcohol use No     Comment: Not currently; Last ETOH 10/2014, previously 1/2 pint daily for 8-10 years.  . Drug use: No  . Sexual activity: No   Other Topics Concern  . Not on file   Social History Narrative   Born: Therapist, music   Occupation: ?   Children:  2 ,  Sarah Parsons (daughter), and Sarah Parsons.  (son)   Hobbies:  none     Review of Systems: General: negative for chills, fever, night sweats or weight changes.  Cardiovascular: negative for chest pain, dyspnea on exertion, edema, orthopnea, palpitations, paroxysmal nocturnal dyspnea or shortness of breath Dermatological: negative for rash Respiratory: negative for cough or wheezing Urologic: negative for hematuria Abdominal: negative for nausea, vomiting, diarrhea, bright red blood per rectum, melena, or hematemesis Neurologic:  negative for visual changes, syncope, or dizziness All other systems reviewed and are otherwise negative except as noted above.    Blood pressure 138/80, pulse 89, height 5\' 9"  (1.753 m), weight 171 lb (77.6 kg), SpO2 99 %.  General appearance: alert, cooperative, appears older than stated age, no distress and in wheel chair Neck: no carotid bruit and no JVD Lungs: clear to auscultation bilaterally Heart: regular rate and rhythm Extremities: no edema Neurologic: Grossly normal   ASSESSMENT AND PLAN:   PAF (paroxysmal atrial fibrillation) (HCC) Asymptomatic PAF- she is in NSR today on exam.   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 F/U Dr Sarah Parsons in 6 months.  Kerin Ransom PA-C 07/01/2016 3:11 PM

## 2016-07-01 NOTE — Assessment & Plan Note (Signed)
Asymptomatic PAF- she is in NSR today on exam.

## 2016-07-03 ENCOUNTER — Other Ambulatory Visit (HOSPITAL_COMMUNITY): Payer: Self-pay | Admitting: Family Medicine

## 2016-07-03 DIAGNOSIS — Z1231 Encounter for screening mammogram for malignant neoplasm of breast: Secondary | ICD-10-CM

## 2016-07-16 ENCOUNTER — Encounter (HOSPITAL_COMMUNITY): Payer: Self-pay

## 2016-07-16 ENCOUNTER — Ambulatory Visit (HOSPITAL_COMMUNITY)
Admission: RE | Admit: 2016-07-16 | Discharge: 2016-07-16 | Disposition: A | Discharge status: Home / Self Care | Payer: Medicare Other | Source: Ambulatory Visit | Attending: Family Medicine | Admitting: Family Medicine

## 2016-07-16 ENCOUNTER — Other Ambulatory Visit (HOSPITAL_COMMUNITY): Payer: Self-pay | Admitting: Family Medicine

## 2016-07-16 DIAGNOSIS — Z1231 Encounter for screening mammogram for malignant neoplasm of breast: Secondary | ICD-10-CM

## 2016-12-29 ENCOUNTER — Ambulatory Visit (INDEPENDENT_AMBULATORY_CARE_PROVIDER_SITE_OTHER): Payer: Medicare Other | Admitting: *Deleted

## 2016-12-29 ENCOUNTER — Encounter: Payer: Self-pay | Admitting: Cardiovascular Disease

## 2016-12-29 ENCOUNTER — Ambulatory Visit (INDEPENDENT_AMBULATORY_CARE_PROVIDER_SITE_OTHER): Payer: Medicare Other | Admitting: Cardiovascular Disease

## 2016-12-29 VITALS — BP 130/66 | HR 89 | Ht 66.0 in | Wt 172.0 lb

## 2016-12-29 DIAGNOSIS — E7800 Pure hypercholesterolemia, unspecified: Secondary | ICD-10-CM

## 2016-12-29 DIAGNOSIS — I10 Essential (primary) hypertension: Secondary | ICD-10-CM

## 2016-12-29 DIAGNOSIS — I480 Paroxysmal atrial fibrillation: Secondary | ICD-10-CM | POA: Diagnosis not present

## 2016-12-29 NOTE — Patient Instructions (Signed)
Your physician wants you to follow-up in:  1 year with Dr Koneswaran You will receive a reminder letter in the mail two months in advance. If you don't receive a letter, please call our office to schedule the follow-up appointment.    Your physician recommends that you continue on your current medications as directed. Please refer to the Current Medication list given to you today.    If you need a refill on your cardiac medications before your next appointment, please call your pharmacy.     Thank you for choosing [facility] !        

## 2016-12-29 NOTE — Progress Notes (Addendum)
Pt was started on Eliquis 5mg  bid for atrial fib on 11/29/15.   Pt denies any bleeding.  No obvious bruising.    Reviewed patients medication list. Pt is not currently on any combined P-gp and strong CYP3A4 inhibitors/inducers (ketoconazole, traconazole, ritonavir, carbamazepine, phenytoin, rifampin, St. John's wort). Reviewed labs 12/29/16 @ [facility]. SCr 0.78   Weight 76kg CrCl 82.82. Dose is appropriate based on age, wt., SCr. Hgb and HCT: 12.4/39.3  A full discussion of the nature of anticoagulants has been carried out. A benefit/risk analysis has been presented to the patient, so that they understand the justification for choosing anticoagulation with Eliquis at this time. The need for compliance is stressed. Pt is aware to take the medication twice daily. Side effects of potential bleeding are discussed, including unusual colored urine or stools, coughing up blood or coffee ground emesis, nose bleeds or serious fall or head trauma. Discussed signs and symptoms of stroke. The patient should avoid any OTC items containing aspirin or ibuprofen. Avoid alcohol consumption. Call if any signs of abnormal bleeding. Discussed financial obligations and resolved any difficulty in obtaining medication. Next lab test in 6 months.  Reviewed labs.  Placed in recall for 6 months.

## 2016-12-29 NOTE — Progress Notes (Signed)
SUBJECTIVE: The patient presents for follow-up of atrial fibrillation.  Echocardiogram 12/18/15: Normal left ventricular systolic and diastolic function and regional wall motion, LVEF 65-70%, mild LVH.  The patient denies any symptoms of chest pain, palpitations, shortness of breath, lightheadedness, dizziness, leg swelling, orthopnea, PND, and syncope.   ECG today shows normal sinus rhythm with nonspecific T-wave abnormalities.    Review of Systems: As per "subjective", otherwise negative.  No Known Allergies  Current Outpatient Prescriptions  Medication Sig Dispense Refill  . acetaminophen (TYLENOL) 500 MG tablet Take 500 mg by mouth 2 (two) times daily.    . Amino Acids-Protein Hydrolys (FEEDING SUPPLEMENT, PRO-STAT SUGAR FREE 64,) LIQD Take 30 mLs by mouth 2 (two) times daily.    Marland Kitchen apixaban (ELIQUIS) 5 MG TABS tablet Take 1 tablet (5 mg total) by mouth 2 (two) times daily.    Marland Kitchen atorvastatin (LIPITOR) 10 MG tablet Take 10 mg by mouth daily at 6 PM.     . Fish Oil OIL Take 1,000 mg by mouth 2 (two) times daily.    . metoprolol succinate (TOPROL XL) 25 MG 24 hr tablet Take 1 tablet (25 mg total) by mouth 2 (two) times daily.    . Multiple Vitamins-Minerals (THERA M PLUS PO) Take 1 tablet by mouth daily.    . potassium chloride SA (K-DUR,KLOR-CON) 20 MEQ tablet Take 1 tablet (20 mEq total) by mouth 2 (two) times daily. 8 tablet 0  . thiamine 100 MG tablet Take 1 tablet (100 mg total) by mouth daily.    . Vitamin D, Ergocalciferol, (DRISDOL) 50000 units CAPS capsule Take 50,000 Units by mouth every 7 (seven) days.     No current facility-administered medications for this visit.     Past Medical History:  Diagnosis Date  . 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 Alcoholic hepatitis 06/12/1750  . Alcoholism /alcohol abuse ([place])   . Anemia   . Cholelithiasis 11/17/2012  . Fatty liver 11/17/2012  . FTT (failure to thrive) in adult  11/16/2012  . Hypercholesteremia   . Hypertension   . Tobacco abuse   . UTI (urinary tract infection) 11/17/2012    Past Surgical History:  Procedure Laterality Date  . COLONOSCOPY WITH PROPOFOL N/A 02/19/2016   Procedure: COLONOSCOPY WITH PROPOFOL;  Surgeon: Danie Binder, MD;  Location: [facility];  Service: Endoscopy;  Laterality: N/A;  0930 - moved to 4/18 @ 9:00 - office notified pt    Social History   Social History  . Marital status: Married    Spouse name: Clinical research associate Sr.   . Number of children: 2  . Years of education: 12    Occupational History  . Not on file.   Social History Main Topics  . Smoking status: Former Smoker    Packs/day: 1.00    Years: 40.00    Types: Cigarettes    Start date: 06/26/1960    Quit date: 06/28/2013  . Smokeless tobacco: Never Used  . Alcohol use No     Comment: Not currently; Last ETOH 10/2014, previously 1/2 pint daily for 8-10 years.  . Drug use: No  . Sexual activity: No   Other Topics Concern  . Not on file   Social History Narrative   Born: Therapist, music   Occupation: ?   Children:  2 ,  [name] (daughter), and Kyli Sorter.  (son)   Hobbies:  none     Vitals:  12/29/16 1320  BP: 130/66  Pulse: 89  SpO2: 97%  Weight: 172 lb (78 kg)  Height: 5\' 6"  (1.676 m)    PHYSICAL EXAM General: NAD HEENT: Tremor. Neck: No JVD, no thyromegaly. Lungs: Clear to auscultation bilaterally with normal respiratory effort. CV: Nondisplaced PMI.  Regular rate and rhythm, normal S1/S2, no S3/S4, no murmur. No pretibial or periankle edema.     Abdomen: Soft, nontender, no distention.  Neurologic: Alert and oriented.  Psych: Normal affect. Skin: Normal. Musculoskeletal: No gross deformities.    ECG: Most recent ECG reviewed.      ASSESSMENT AND PLAN: 1. Paroxysmal atrial fibrillation: Symptomatically stable on Toprol XL 25 mg bid. CHA2DS2VASc score is 3 thus elevated thromboembolic risk. Will continue  Eliquis 5 mg bid.  2. Essential HTN: Controlled on amlodipine 5 mg. No changes.  3. Hyperlipidemia: On Lipitor 10 mg.  Dispo: f/u 1 yr   Kate Sable, M.D., F.A.C.C.

## 2017-04-27 LAB — TSH: TSH: 0 — AB (ref <=5.90)

## 2017-05-20 LAB — TSH: TSH: 0.02 — AB (ref <=5.90)

## 2017-06-09 ENCOUNTER — Ambulatory Visit (INDEPENDENT_AMBULATORY_CARE_PROVIDER_SITE_OTHER): Payer: Medicare Other | Admitting: "Endocrinology

## 2017-06-09 ENCOUNTER — Encounter: Payer: Self-pay | Admitting: "Endocrinology

## 2017-06-09 VITALS — BP 128/77 | HR 85

## 2017-06-09 DIAGNOSIS — E042 Nontoxic multinodular goiter: Secondary | ICD-10-CM | POA: Diagnosis not present

## 2017-06-09 DIAGNOSIS — E0590 Thyrotoxicosis, unspecified without thyrotoxic crisis or storm: Secondary | ICD-10-CM | POA: Diagnosis not present

## 2017-06-09 NOTE — Progress Notes (Signed)
Subjective:    Patient ID: Sarah Parsons, female    DOB: [birth year], PCP Jani Gravel, MD   Past Medical History:  Diagnosis Date  . 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 Alcoholic hepatitis 0/62/6948  . Alcoholism /alcohol abuse ([place])   . Anemia   . Cholelithiasis 11/17/2012  . Fatty liver 11/17/2012  . FTT (failure to thrive) in adult 11/16/2012  . Hypercholesteremia   . Hypertension   . Tobacco abuse   . UTI (urinary tract infection) 11/17/2012   Past Surgical History:  Procedure Laterality Date  . COLONOSCOPY WITH PROPOFOL N/A 02/19/2016   Procedure: COLONOSCOPY WITH PROPOFOL;  Surgeon: Danie Binder, MD;  Location: [facility];  Service: Endoscopy;  Laterality: N/A;  0930 - moved to 4/18 @ 9:00 - office notified pt   Social History   Social History  . Marital status: Married    Spouse name: Clinical research associate Sr.   . Number of children: 2  . Years of education: 86    Social History Main Topics  . Smoking status: Former Smoker    Packs/day: 1.00    Years: 40.00    Types: Cigarettes    Start date: 06/26/1960    Quit date: 06/28/2013  . Smokeless tobacco: Never Used  . Alcohol use No     Comment: Not currently; Last ETOH 10/2014, previously 1/2 pint daily for 8-10 years.  . Drug use: No  . Sexual activity: No   Other Topics Concern  . None   Social History Narrative   Born: Therapist, music   Occupation: ?   Children:  2 ,  [name] (daughter), and Janeane Cozart.  (son)   Hobbies:  none   Outpatient Encounter Prescriptions as of 06/09/2017  Medication Sig  . acetaminophen (TYLENOL) 500 MG tablet Take 500 mg by mouth 2 (two) times daily.  . Amino Acids-Protein Hydrolys (FEEDING SUPPLEMENT, PRO-STAT SUGAR FREE 64,) LIQD Take 30 mLs by mouth 2 (two) times daily.  Marland Kitchen apixaban (ELIQUIS) 5 MG TABS tablet Take 1 tablet (5 mg total) by mouth 2 (two) times daily.  Marland Kitchen atorvastatin (LIPITOR) 10 MG tablet Take 10 mg  by mouth daily at 6 PM.   . Fish Oil OIL Take 1,000 mg by mouth 2 (two) times daily.  . methimazole (TAPAZOLE) 5 MG tablet Take 5 mg by mouth 3 (three) times daily.  . metoprolol succinate (TOPROL XL) 25 MG 24 hr tablet Take 1 tablet (25 mg total) by mouth 2 (two) times daily.  . Mirtazapine (REMERON PO) Take by mouth.  . Multiple Vitamins-Minerals (THERA M PLUS PO) Take 1 tablet by mouth daily.  . potassium chloride SA (K-DUR,KLOR-CON) 20 MEQ tablet Take 1 tablet (20 mEq total) by mouth 2 (two) times daily.  Marland Kitchen thiamine 100 MG tablet Take 1 tablet (100 mg total) by mouth daily.  . Vitamin D, Ergocalciferol, (DRISDOL) 50000 units CAPS capsule Take 50,000 Units by mouth every 7 (seven) days.   No facility-administered encounter medications on file as of 06/09/2017.    ALLERGIES: No Known Allergies  VACCINATION STATUS:  There is no immunization history on file for this patient.  HPI 71 year old female patient with multiple medical problems as above. She is being seen in consultation for hyperthyroidism and nodular goiter requested by Dr. Maudie Mercury. -She is a poor historian nursing home resident due to failure to thrive,  does not recall the details of her history. She is accompanied by her  daughter who doesn't know the details of her history either. - In her records series history of hypothyroidism, not currently on thyroid hormone replacement. - In June 2018 free T4 was determined and was found to be elevated at 1.86 (normal 0.93-1.7). -She is started on methimazole 5 mg by mouth daily. Labs from 05/20/2017 showed TSH of 0.02 along with free T4 1.09. - Her referral  package also mentions that she has thyroid nodules, however no record of thyroid ultrasound. - Reportedly she does not eat well,  regained 4 pounds since April 2018.  - She denies dysphagia, shortness of breath. - Her history includes long-standing history of alcohol abuse complicated by alcoholic hepatitis.  Review of  Systems  Constitutional: + 4 lbs og  weight gain, + wheelchair-bound due to deconditioning and disequilibrium Eyes: no blurry vision, no xerophthalmia ENT: no sore throat, no nodules palpated in throat, no dysphagia/odynophagia, no hoarseness Cardiovascular: no Chest Pain, no Shortness of Breath, no palpitations, no leg swelling Respiratory: no cough, no SOB Gastrointestinal: no Nausea/Vomiting/Diarhhea Musculoskeletal: no muscle/joint aches Skin: no rashes Neurological: no tremors, no numbness, no tingling, no dizziness Psychiatric: no depression, no anxiety  Objective:    BP 128/77   Pulse 85   SpO2 99%   Wt Readings from Last 3 Encounters:  12/29/16 172 lb (78 kg)  07/01/16 171 lb (77.6 kg)  02/19/16 168 lb (76.2 kg)    Physical Exam  Constitutional: + Light build,  Wheelchair-bound , not in acute distress, normal state of mind Eyes: PERRLA, EOMI, no exophthalmos ENT: moist mucous membranes, + palpable thyroid, no cervical lymphadenopathy Cardiovascular: normal precordial activity, Regular Rate and Rhythm, no Murmur/Rubs/Gallops Respiratory:  adequate breathing efforts, no gross chest deformity, Clear to auscultation bilaterally Gastrointestinal: abdomen soft, Non -tender, No distension, Bowel Sounds present Musculoskeletal: no gross deformities, strength intact in all four extremities Skin: moist, warm, no rashes Neurological: + gross  tremor with outstretched hands, Deep tendon reflexes normal in all four extremities.  CMP ( most recent) CMP     Component Value Date/Time   NA 142 02/13/2016 0855   K 4.0 02/13/2016 0855   CL 107 02/13/2016 0855   CO2 28 02/13/2016 0855   GLUCOSE 86 02/13/2016 0855   BUN 18 02/13/2016 0855   CREATININE 0.96 02/13/2016 0855   CALCIUM 9.1 02/13/2016 0855   PROT 6.7 07/06/2013 0912   ALBUMIN 2.7 (L) 07/06/2013 0912   AST 85 (H) 07/06/2013 0912   ALT 53 (H) 07/06/2013 0912   ALKPHOS 114 07/06/2013 0912   BILITOT 1.1 07/06/2013 0912    GFRNONAA 60 (L) 02/13/2016 0855   GFRAA >60 02/13/2016 0855   04/27/2017 free T4 1.86 05/20/2017 TSH 0.02, free T4 1.09    Assessment & Plan:   1. Hyperthyroidism - Patient with history of dementia , alcoholic encephalopathy, poor historian. She appears to have history of hypothyroidism in the past (details of which are not available to review), however was diagnosed with hyperthyroidism in June 2018 for which she is on Tapazole 5 mg by mouth daily. Her most recent labs show free T4 of 1.09 along with still suppressed TSH of 0.02.  - I advised her to continue Tapazole 5 mg by mouth daily. We'll repeat her thyroid function tests in 4 weeks.  - After appropriate control of thyroid function, she will be considered for thyroid uptake and scan if appropriate.   2. Multinodular goiter - I will proceed to obtain baseline thyroid/neck ultrasound, given history of nodular goiter.  -  I advised patient to maintain close follow up with Jani Gravel, MD for primary care needs. Follow up plan: Return in about 4 weeks (around 07/07/2017) for Thyroid / Neck Ultrasound, follow up with pre-visit labs.  Glade Lloyd, MD Phone: [phone]  Fax: [phone]   06/09/2017, 9:23 AM

## 2017-06-16 ENCOUNTER — Ambulatory Visit (HOSPITAL_COMMUNITY)
Admission: RE | Admit: 2017-06-16 | Discharge: 2017-06-16 | Disposition: A | Discharge status: Home / Self Care | Payer: Medicare Other | Source: Ambulatory Visit | Attending: "Endocrinology | Admitting: "Endocrinology

## 2017-06-16 DIAGNOSIS — E042 Nontoxic multinodular goiter: Secondary | ICD-10-CM | POA: Insufficient documentation

## 2017-06-30 LAB — TSH: TSH: 5.34 (ref <=5.90)

## 2017-07-07 ENCOUNTER — Encounter: Payer: Self-pay | Admitting: "Endocrinology

## 2017-07-07 ENCOUNTER — Ambulatory Visit (INDEPENDENT_AMBULATORY_CARE_PROVIDER_SITE_OTHER): Payer: Medicare Other | Admitting: "Endocrinology

## 2017-07-07 VITALS — BP 138/78 | HR 84 | Ht 66.0 in

## 2017-07-07 DIAGNOSIS — E0590 Thyrotoxicosis, unspecified without thyrotoxic crisis or storm: Secondary | ICD-10-CM | POA: Diagnosis not present

## 2017-07-07 NOTE — Progress Notes (Signed)
Subjective:    Patient ID: Sarah Parsons, female    DOB: 16-Nov-1945, PCP Jani Gravel, MD   Past Medical History:  Diagnosis Date  . 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 Alcoholic hepatitis 02/25/9562  . Alcoholism /alcohol abuse ([place])   . Anemia   . Cholelithiasis 11/17/2012  . Fatty liver 11/17/2012  . FTT (failure to thrive) in adult 11/16/2012  . Hypercholesteremia   . Hypertension   . Tobacco abuse   . UTI (urinary tract infection) 11/17/2012   Past Surgical History:  Procedure Laterality Date  . COLONOSCOPY WITH PROPOFOL N/A 02/19/2016   Procedure: COLONOSCOPY WITH PROPOFOL;  Surgeon: Danie Binder, MD;  [facility];  Service: Endoscopy;  Laterality: N/A;  0930 - moved to 4/18 @ 9:00 - office notified pt   Social History   Social History  . Marital status: Married    Spouse name: Clinical research associate Sr.   . Number of children: 2  . Years of education: 1    Social History Main Topics  . Smoking status: Former Smoker    Packs/day: 1.00    Years: 40.00    Types: Cigarettes    Start date: 06/26/1960    Quit date: 06/28/2013  . Smokeless tobacco: Never Used  . Alcohol use No     Comment: Not currently; Last ETOH 10/2014, previously 1/2 pint daily for 8-10 years.  . Drug use: No  . Sexual activity: No   Other Topics Concern  . None   Social History Narrative   Born: Therapist, music   Occupation: ?   Children:  2 ,  [name] (daughter), and [name].  (son)   Hobbies:  none   Outpatient Encounter Prescriptions as of 07/07/2017  Medication Sig  . acetaminophen (TYLENOL) 500 MG tablet Take 500 mg by mouth 2 (two) times daily.  . Amino Acids-Protein Hydrolys (FEEDING SUPPLEMENT, PRO-STAT SUGAR FREE 64,) LIQD Take 30 mLs by mouth 2 (two) times daily.  Marland Kitchen apixaban (ELIQUIS) 5 MG TABS tablet Take 1 tablet (5 mg total) by mouth 2 (two) times daily.  Marland Kitchen atorvastatin (LIPITOR) 10 MG tablet Take 10 mg by  mouth daily at 6 PM.   . Fish Oil OIL Take 1,000 mg by mouth 2 (two) times daily.  . metoprolol succinate (TOPROL XL) 25 MG 24 hr tablet Take 1 tablet (25 mg total) by mouth 2 (two) times daily.  . Mirtazapine (REMERON PO) Take by mouth.  . Multiple Vitamins-Minerals (THERA M PLUS PO) Take 1 tablet by mouth daily.  . potassium chloride SA (K-DUR,KLOR-CON) 20 MEQ tablet Take 1 tablet (20 mEq total) by mouth 2 (two) times daily.  Marland Kitchen thiamine 100 MG tablet Take 1 tablet (100 mg total) by mouth daily.  . Vitamin D, Ergocalciferol, (DRISDOL) 50000 units CAPS capsule Take 50,000 Units by mouth every 7 (seven) days.  . [DISCONTINUED] methimazole (TAPAZOLE) 5 MG tablet Take 5 mg by mouth 3 (three) times daily.   No facility-administered encounter medications on file as of 07/07/2017.    ALLERGIES: Allergies  Allergen Reactions  . Ativan [Lorazepam]   . Ciprofloxacin   . Penicillins     VACCINATION STATUS:  There is no immunization history on file for this patient.  HPI 71 year old female patient with multiple medical problems as above. She is being seen in f/u For history of  hyperthyroidism and nodular goiter . - She was sent for repeat thyroid function tests and thyroid/neck ultrasound  during her last visit.  -She is a poor historian nursing home resident due to failure to thrive,  does not recall the details of her history. She is accompanied by her daughter who doesn't know the details of her history either. - she was initiated on Tapazole 5 mg by mouth daily during her last visit.  - her repeat labs from 06/30/2017 showed TSH high at 5.34, free T4 low at 0.73.  - She denies dysphagia, shortness of breath. - Her history includes long-standing history of alcohol abuse complicated by alcoholic hepatitis.  Review of Systems  Constitutional: +  steady weight , + wheelchair-bound due to deconditioning and disequilibrium Eyes: no blurry vision, no xerophthalmia ENT: no sore throat, no nodules  palpated in throat, no dysphagia/odynophagia, no hoarseness Cardiovascular: no Chest Pain, no Shortness of Breath, no palpitations, no leg swelling Respiratory: no cough, no SOB Gastrointestinal: no Nausea/Vomiting/Diarhhea Musculoskeletal: no muscle/joint aches Skin: no rashes Neurological: no tremors, no numbness, no tingling, no dizziness Psychiatric: no depression, no anxiety  Objective:    BP 138/78   Pulse 84   Ht 5\' 6"  (1.676 m)   Wt Readings from Last 3 Encounters:  12/29/16 172 lb (78 kg)  07/01/16 171 lb (77.6 kg)  02/19/16 168 lb (76.2 kg)    Physical Exam  Constitutional: + Wheelchair-bound , not in acute distress, normal state of mind Eyes: PERRLA, EOMI, no exophthalmos ENT: moist mucous membranes, No thyromegaly, no cervical lymphadenopathy Cardiovascular: normal precordial activity, Regular Rate and Rhythm, no Murmur/Rubs/Gallops Respiratory:  adequate breathing efforts, no gross chest deformity, Clear to auscultation bilaterally Gastrointestinal: abdomen soft, Non -tender, No distension, Bowel Sounds present Musculoskeletal: no gross deformities, strength intact in all four extremities Skin: moist, warm, no rashes Neurological: + gross  tremor with outstretched hands, Deep tendon reflexes normal in all four extremities.  CMP ( most recent) CMP     Component Value Date/Time   NA 142 02/13/2016 0855   K 4.0 02/13/2016 0855   CL 107 02/13/2016 0855   CO2 28 02/13/2016 0855   GLUCOSE 86 02/13/2016 0855   BUN 18 02/13/2016 0855   CREATININE 0.96 02/13/2016 0855   CALCIUM 9.1 02/13/2016 0855   PROT 6.7 07/06/2013 0912   ALBUMIN 2.7 (L) 07/06/2013 0912   AST 85 (H) 07/06/2013 0912   ALT 53 (H) 07/06/2013 0912   ALKPHOS 114 07/06/2013 0912   BILITOT 1.1 07/06/2013 0912   GFRNONAA 60 (L) 02/13/2016 0855   GFRAA >60 02/13/2016 0855   04/27/2017 :  free T4 1.86 05/20/2017 : TSH 0.02, free T4 1.09   Labs from 06/30/2017: TSH 5.34, free T4 0.73, free T3  3.1.  Assessment & Plan:   1. Hyperthyroidism -  she has quickly responded to low-dose Tapazole therapy which is now causing iatrogenic hypothyroidism. I discussed and discontinued Tapazole for now. She will have repeat thyroid function test in 3 months with office visit.   2. Multinodular goiter -  her thyroid/neck ultrasound is unremarkable, no nodular lesions.  - I advised patient to maintain close follow up with Jani Gravel, MD for primary care needs. Follow up plan: Return in about 3 months (around 10/06/2017) for follow up with pre-visit labs.  Glade Lloyd, MD Phone: [phone]  Fax: [phone]   This note was partially dictated with voice recognition software. Similar sounding words can be transcribed inadequately or may not  be corrected upon review.  07/07/2017, 10:18 AM

## 2017-07-20 ENCOUNTER — Ambulatory Visit (INDEPENDENT_AMBULATORY_CARE_PROVIDER_SITE_OTHER): Payer: Medicare Other | Admitting: *Deleted

## 2017-07-20 DIAGNOSIS — I4891 Unspecified atrial fibrillation: Secondary | ICD-10-CM

## 2017-07-20 NOTE — Progress Notes (Addendum)
Pt was started on Eliquis 5mg  bid for atrial fib on 11/29/15.   Pt denies any bleeding. No obvious bruising.   Reviewed patients medication list. Pt is not currently on any combined P-gp and strong CYP3A4 inhibitors/inducers (ketoconazole, traconazole, ritonavir, carbamazepine, phenytoin, rifampin, St. John's wort). Reviewed labs 07/21/17 @ [facility].SCr 1.07Weight 73kgCrCl . Dose is appropriate based on age, wt., SCr 57.19Hgb and HCT: 12.2/38.5  A full discussion of the nature of anticoagulants has been carried out. A benefit/risk analysis has been presented to the patient, so that they understand the justification for choosing anticoagulation with Eliquis at this time. The need for compliance is stressed. Pt is aware to take the medication twice daily. Side effects of potential bleeding are discussed, including unusual colored urine or stools, coughing up blood or coffee ground emesis, nose bleeds or serious fall or head trauma. Discussed signs and symptoms of stroke. The patient should avoid any OTC items containing aspirin or ibuprofen. Avoid alcohol consumption. Call if any signs of abnormal bleeding. Discussed financial obligations and resolved any difficulty in obtaining medication. Next lab test in 6 months.  Marland Kitchen

## 2017-08-29 LAB — TSH: TSH: 2.8 (ref 0.41–5.90)

## 2017-10-08 ENCOUNTER — Ambulatory Visit (INDEPENDENT_AMBULATORY_CARE_PROVIDER_SITE_OTHER): Payer: Medicare Other | Admitting: "Endocrinology

## 2017-10-08 ENCOUNTER — Encounter: Payer: Self-pay | Admitting: "Endocrinology

## 2017-10-08 VITALS — BP 128/78 | HR 72 | Ht 68.0 in

## 2017-10-08 DIAGNOSIS — E0590 Thyrotoxicosis, unspecified without thyrotoxic crisis or storm: Secondary | ICD-10-CM

## 2017-10-08 NOTE — Progress Notes (Signed)
Subjective:    Patient ID: Sarah Parsons, female    DOB: 10/01/1946, PCP Jani Gravel, MD   Past Medical History:  Diagnosis Date  . 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 Alcoholic hepatitis 07/01/5620  . Alcoholism /alcohol abuse ([place])   . Anemia   . Cholelithiasis 11/17/2012  . Fatty liver 11/17/2012  . FTT (failure to thrive) in adult 11/16/2012  . Hypercholesteremia   . Hypertension   . Tobacco abuse   . UTI (urinary tract infection) 11/17/2012   Past Surgical History:  Procedure Laterality Date  . COLONOSCOPY WITH PROPOFOL N/A 02/19/2016   Procedure: COLONOSCOPY WITH PROPOFOL;  Surgeon: Danie Binder, MD;  [facility];  Service: Endoscopy;  Laterality: N/A;  0930 - moved to 4/18 @ 9:00 - office notified pt   Social History   Socioeconomic History  . Marital status: Married    Spouse name: Clinical research associate Sr.   . Number of children: 2  . Years of education: 56   . Highest education level: None  Social Needs  . Financial resource strain: None  . Food insecurity - worry: None  . Food insecurity - inability: None  . Transportation needs - medical: None  . Transportation needs - non-medical: None  Occupational History  . None  Tobacco Use  . Smoking status: Former Smoker    Packs/day: 1.00    Years: 40.00    Pack years: 40.00    Types: Cigarettes    Start date: 06/26/1960    Last attempt to quit: 06/28/2013    Years since quitting: 4.2  . Smokeless tobacco: Never Used  Substance and Sexual Activity  . Alcohol use: No    Alcohol/week: 0.0 oz    Comment: Not currently; Last ETOH 10/2014, previously 1/2 pint daily for 8-10 years.  . Drug use: No  . Sexual activity: No  Other Topics Concern  . None  Social History Narrative   Born: Therapist, music   Occupation: ?   Children:  2 ,  [name] (daughter), and Kitara Hebb.  (son)   Hobbies:  none   Outpatient Encounter Medications as of 10/08/2017   Medication Sig  . acetaminophen (TYLENOL) 500 MG tablet Take 500 mg by mouth 2 (two) times daily.  . Amino Acids-Protein Hydrolys (FEEDING SUPPLEMENT, PRO-STAT SUGAR FREE 64,) LIQD Take 30 mLs by mouth 2 (two) times daily.  Marland Kitchen apixaban (ELIQUIS) 5 MG TABS tablet Take 1 tablet (5 mg total) by mouth 2 (two) times daily.  Marland Kitchen atorvastatin (LIPITOR) 10 MG tablet Take 10 mg by mouth daily at 6 PM.   . Fish Oil OIL Take 1,000 mg by mouth 2 (two) times daily.  . metoprolol succinate (TOPROL XL) 25 MG 24 hr tablet Take 1 tablet (25 mg total) by mouth 2 (two) times daily.  . Mirtazapine (REMERON PO) Take by mouth.  . Multiple Vitamins-Minerals (THERA M PLUS PO) Take 1 tablet by mouth daily.  . potassium chloride SA (K-DUR,KLOR-CON) 20 MEQ tablet Take 1 tablet (20 mEq total) by mouth 2 (two) times daily.  Marland Kitchen thiamine 100 MG tablet Take 1 tablet (100 mg total) by mouth daily.  . Vitamin D, Ergocalciferol, (DRISDOL) 50000 units CAPS capsule Take 50,000 Units by mouth every 7 (seven) days.   No facility-administered encounter medications on file as of 10/08/2017.    ALLERGIES: Allergies  Allergen Reactions  . Ativan [Lorazepam]   . Ciprofloxacin   . Penicillins  VACCINATION STATUS:  There is no immunization history on file for this patient.  HPI 71 year old female patient with multiple medical problems as above. She is being seen in f/u for history of  hyperthyroidism and nodular goiter . - She was briefly treated with Tapazole for hyperthyroidism to which she responded quickly. - She was kept off of medications since last visit. She returns with repeat thyroid function test showing improvement to target. -She is a poor historian nursing home resident due to failure to thrive,  does not recall the details of her history. She is not accompanied by any caretaker today.  - She denies dysphagia, shortness of breath. - Her history includes long-standing history of alcohol abuse complicated by  alcoholic hepatitis.  Review of Systems  Constitutional: +  steady weight , + wheelchair-bound due to deconditioning and disequilibrium Eyes: no blurry vision, no xerophthalmia ENT: no sore throat, no nodules palpated in throat, no dysphagia/odynophagia, no hoarseness Cardiovascular: no Chest Pain, no Shortness of Breath, no palpitations, no leg swelling Respiratory: no cough, no SOB Gastrointestinal: no Nausea/Vomiting/Diarhhea Musculoskeletal: no muscle/joint aches Skin: no rashes Neurological: no tremors, no numbness, no tingling, no dizziness Psychiatric: no depression, no anxiety  Objective:    BP 128/78   Pulse 72   Ht 5\' 8"  (1.727 m)   BMI 26.15 kg/m   Wt Readings from Last 3 Encounters:  12/29/16 172 lb (78 kg)  07/01/16 171 lb (77.6 kg)  02/19/16 168 lb (76.2 kg)    Physical Exam  Constitutional: + Wheelchair-bound , not in acute distress, normal state of mind Eyes: PERRLA, EOMI, no exophthalmos ENT: moist mucous membranes, No thyromegaly, no cervical lymphadenopathy Cardiovascular: normal precordial activity, Regular Rate and Rhythm, no Murmur/Rubs/Gallops Respiratory:  adequate breathing efforts, no gross chest deformity, Clear to auscultation bilaterally Gastrointestinal: abdomen soft, Non -tender, No distension, Bowel Sounds present Musculoskeletal: no gross deformities, strength intact in all four extremities Skin: moist, warm, no rashes Neurological: + gross  tremor with outstretched hands, Deep tendon reflexes normal in all four extremities.  CMP ( most recent) CMP     Component Value Date/Time   NA 142 02/13/2016 0855   K 4.0 02/13/2016 0855   CL 107 02/13/2016 0855   CO2 28 02/13/2016 0855   GLUCOSE 86 02/13/2016 0855   BUN 18 02/13/2016 0855   CREATININE 0.96 02/13/2016 0855   CALCIUM 9.1 02/13/2016 0855   PROT 6.7 07/06/2013 0912   ALBUMIN 2.7 (L) 07/06/2013 0912   AST 85 (H) 07/06/2013 0912   ALT 53 (H) 07/06/2013 0912   ALKPHOS 114  07/06/2013 0912   BILITOT 1.1 07/06/2013 0912   GFRNONAA 60 (L) 02/13/2016 0855   GFRAA >60 02/13/2016 0855   Labs from 08/29/2017: TSH 2.heat, free T4 0.9 Labs from 06/30/2017: TSH 5.34, free T4 0.73, free T3 3.1. 04/27/2017 :  free T4 1.86 05/20/2017 : TSH 0.02, free T4 1.09    Assessment & Plan:   1. Hyperthyroidism -  She remains euthyroid him a previously treated with low-dose Tapazole. - She would not require intervention at this time.  She will have repeat thyroid function test in 3 months with office visit.   2. Multinodular goiter -  her thyroid/neck ultrasound is unremarkable, no nodular lesions.  - I advised patient to maintain close follow up with Jani Gravel, MD for primary care needs. Follow up plan: Return in about 3 months (around 01/06/2018) for follow up with pre-visit labs.  Glade Lloyd, MD Phone: [phone]  Fax:  [phone]   This note was partially dictated with voice recognition software. Similar sounding words can be transcribed inadequately or may not  be corrected upon review.  10/08/2017, 1:10 PM

## 2018-01-04 ENCOUNTER — Encounter: Payer: Self-pay | Admitting: Cardiovascular Disease

## 2018-01-04 ENCOUNTER — Ambulatory Visit (INDEPENDENT_AMBULATORY_CARE_PROVIDER_SITE_OTHER): Payer: Medicare Other | Admitting: Cardiovascular Disease

## 2018-01-04 VITALS — BP 134/70 | HR 77 | Ht 66.0 in

## 2018-01-04 DIAGNOSIS — Z7901 Long term (current) use of anticoagulants: Secondary | ICD-10-CM | POA: Diagnosis not present

## 2018-01-04 DIAGNOSIS — I10 Essential (primary) hypertension: Secondary | ICD-10-CM | POA: Diagnosis not present

## 2018-01-04 DIAGNOSIS — E7800 Pure hypercholesterolemia, unspecified: Secondary | ICD-10-CM

## 2018-01-04 DIAGNOSIS — I480 Paroxysmal atrial fibrillation: Secondary | ICD-10-CM

## 2018-01-04 NOTE — Progress Notes (Signed)
SUBJECTIVE: The patient presents for annual follow-up of paroxysmal atrial fibrillation.  Echocardiogram 12/18/15: Normal left ventricular systolic and diastolic function and regional wall motion, LVEF 65-70%, mild LVH.  ECG performed today which I personally reviewed demonstrated sinus rhythm with diffuse nonspecific T wave abnormalities.  The patient denies any symptoms of chest pain, palpitations, shortness of breath, lightheadedness, dizziness, leg swelling, orthopnea, PND, and syncope.    Review of Systems: As per "subjective", otherwise negative.  Allergies  Allergen Reactions  . Ativan [Lorazepam]   . Ciprofloxacin   . Penicillins     Current Outpatient Medications  Medication Sig Dispense Refill  . acetaminophen (TYLENOL) 500 MG tablet Take 500 mg by mouth 2 (two) times daily.    . Amino Acids-Protein Hydrolys (FEEDING SUPPLEMENT, PRO-STAT SUGAR FREE 64,) LIQD Take 30 mLs by mouth 2 (two) times daily.    Marland Kitchen apixaban (ELIQUIS) 5 MG TABS tablet Take 1 tablet (5 mg total) by mouth 2 (two) times daily.    Marland Kitchen atorvastatin (LIPITOR) 10 MG tablet Take 10 mg by mouth daily at 6 PM.     . Fish Oil OIL Take 1,000 mg by mouth 2 (two) times daily.    . metoprolol succinate (TOPROL XL) 25 MG 24 hr tablet Take 1 tablet (25 mg total) by mouth 2 (two) times daily.    . Mirtazapine (REMERON PO) Take by mouth.    . Multiple Vitamins-Minerals (THERA M PLUS PO) Take 1 tablet by mouth daily.    . potassium chloride SA (K-DUR,KLOR-CON) 20 MEQ tablet Take 1 tablet (20 mEq total) by mouth 2 (two) times daily. 8 tablet 0  . thiamine 100 MG tablet Take 1 tablet (100 mg total) by mouth daily.    . Vitamin D, Ergocalciferol, (DRISDOL) 50000 units CAPS capsule Take 50,000 Units by mouth every 7 (seven) days.     No current facility-administered medications for this visit.     Past Medical History:  Diagnosis Date  . 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 Alcoholic hepatitis 3/00/9233  . Alcoholism /alcohol abuse ([place])   . Anemia   . Cholelithiasis 11/17/2012  . Fatty liver 11/17/2012  . FTT (failure to thrive) in adult 11/16/2012  . Hypercholesteremia   . Hypertension   . Tobacco abuse   . UTI (urinary tract infection) 11/17/2012    Past Surgical History:  Procedure Laterality Date  . COLONOSCOPY WITH PROPOFOL N/A 02/19/2016   Procedure: COLONOSCOPY WITH PROPOFOL;  Surgeon: Danie Binder, MD;  [facility];  Service: Endoscopy;  Laterality: N/A;  0930 - moved to 4/18 @ 9:00 - office notified pt    Social History   Socioeconomic History  . Marital status: Married    Spouse name: Clinical research associate Sr.   . Number of children: 2  . Years of education: 90   . Highest education level: Not on file  Social Needs  . Financial resource strain: Not on file  . Food insecurity - worry: Not on file  . Food insecurity - inability: Not on file  . Transportation needs - medical: Not on file  . Transportation needs - non-medical: Not on file  Occupational History  . Not on file  Tobacco Use  . Smoking status: Former Smoker    Packs/day: 1.00    Years: 40.00    Pack years: 40.00    Types: Cigarettes    Start date: 06/26/1960    Last attempt  to quit: 06/28/2013    Years since quitting: 4.5  . Smokeless tobacco: Never Used  Substance and Sexual Activity  . Alcohol use: No    Alcohol/week: 0.0 oz    Comment: Not currently; Last ETOH 10/2014, previously 1/2 pint daily for 8-10 years.  . Drug use: No  . Sexual activity: No  Other Topics Concern  . Not on file  Social History Narrative   Born: Therapist, music   Occupation: ?   Children:  2 ,  [name] (daughter), and Leather Estis.  (son)   Hobbies:  none     Vitals:   01/04/18 1311  BP: 134/70  Pulse: 77  Height: 5\' 6"  (1.676 m)    Wt Readings from Last 3 Encounters:  12/29/16 172 lb (78 kg)  07/01/16 171 lb (77.6 kg)  02/19/16 168 lb (76.2 kg)       PHYSICAL EXAM General: NAD HEENT: Normal. Neck: No JVD, no thyromegaly. Lungs: Clear to auscultation bilaterally with normal respiratory effort. CV: Regular rate and rhythm, normal S1/S2, no S3/S4, no murmur. No pretibial or periankle edema.  No carotid bruit.   Abdomen: Soft, nontender, no distention.  Neurologic: Alert and oriented. Head tremor noted. Psych: Normal affect. Skin: Normal. Musculoskeletal: No gross deformities.    ECG: Most recent ECG reviewed.   Labs: Lab Results  Component Value Date/Time   K 4.0 02/13/2016 08:55 AM   BUN 18 02/13/2016 08:55 AM   CREATININE 0.96 02/13/2016 08:55 AM   ALT 53 (H) 07/06/2013 09:12 AM   TSH 2.80 08/29/2017   TSH 3.115 07/05/2013 07:33 PM   HGB 12.9 02/13/2016 08:55 AM     Lipids: No results found for: LDLCALC, LDLDIRECT, CHOL, TRIG, HDL     ASSESSMENT AND PLAN: 1. Paroxysmal atrial fibrillation: Symptomatically stable on Toprol XL 25 mg bid. CHA2DS2VASc score is 3 thus elevated thromboembolic risk. Will continue Eliquis 5 mg bid.  2. Essential HTN: Controlled on present therapy. No changes.  3. Hyperlipidemia: On Lipitor 10 mg.      Disposition: Follow up 1 year   Kate Sable, M.D., F.A.C.C.

## 2018-01-04 NOTE — Patient Instructions (Signed)
Your physician wants you to follow-up in:  1 year with Dr.Koneswaran You will receive a reminder letter in the mail two months in advance. If you don't receive a letter, please call our office to schedule the follow-up appointment.    Your physician recommends that you continue on your current medications as directed. Please refer to the Current Medication list given to you today.    If you need a refill on your cardiac medications before your next appointment, please call your pharmacy.      No lab work or tests ordered today.      Thank you for choosing [facility] !        

## 2018-01-06 ENCOUNTER — Ambulatory Visit: Payer: Medicare Other | Admitting: "Endocrinology

## 2018-01-21 ENCOUNTER — Encounter: Payer: Self-pay | Admitting: "Endocrinology

## 2018-01-21 ENCOUNTER — Ambulatory Visit (INDEPENDENT_AMBULATORY_CARE_PROVIDER_SITE_OTHER): Payer: Medicare Other | Admitting: "Endocrinology

## 2018-01-21 VITALS — BP 128/75 | HR 92

## 2018-01-21 DIAGNOSIS — E0590 Thyrotoxicosis, unspecified without thyrotoxic crisis or storm: Secondary | ICD-10-CM | POA: Diagnosis not present

## 2018-01-21 NOTE — Progress Notes (Signed)
Subjective:    Patient ID: Sarah Parsons, female    DOB: 1946-07-07, PCP Jani Gravel, MD   Past Medical History:  Diagnosis Date  . 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 Alcoholic hepatitis 04/27/6388  . Alcoholism /alcohol abuse ([place])   . Anemia   . Cholelithiasis 11/17/2012  . Fatty liver 11/17/2012  . FTT (failure to thrive) in adult 11/16/2012  . Hypercholesteremia   . Hypertension   . Tobacco abuse   . UTI (urinary tract infection) 11/17/2012   Past Surgical History:  Procedure Laterality Date  . COLONOSCOPY WITH PROPOFOL N/A 02/19/2016   Procedure: COLONOSCOPY WITH PROPOFOL;  Surgeon: Danie Binder, MD;  [facility];  Service: Endoscopy;  Laterality: N/A;  0930 - moved to 4/18 @ 9:00 - office notified pt   Social History   Socioeconomic History  . Marital status: Married    Spouse name: Clinical research associate Sr.   . Number of children: 2  . Years of education: 20   . Highest education level: Not on file  Occupational History  . Not on file  Social Needs  . Financial resource strain: Not on file  . Food insecurity:    Worry: Not on file    Inability: Not on file  . Transportation needs:    Medical: Not on file    Non-medical: Not on file  Tobacco Use  . Smoking status: Former Smoker    Packs/day: 1.00    Years: 40.00    Pack years: 40.00    Types: Cigarettes    Start date: 06/26/1960    Last attempt to quit: 06/28/2013    Years since quitting: 4.5  . Smokeless tobacco: Never Used  Substance and Sexual Activity  . Alcohol use: No    Alcohol/week: 0.0 oz    Comment: Not currently; Last ETOH 10/2014, previously 1/2 pint daily for 8-10 years.  . Drug use: No  . Sexual activity: Never  Lifestyle  . Physical activity:    Days per week: Not on file    Minutes per session: Not on file  . Stress: Not on file  Relationships  . Social connections:    Talks on phone: Not on file    Gets together: Not on  file    Attends religious service: Not on file    Active member of club or organization: Not on file    Attends meetings of clubs or organizations: Not on file    Relationship status: Not on file  Other Topics Concern  . Not on file  Social History Narrative   Born: Therapist, music   Occupation: ?   Children:  2 ,  [name] (daughter), and Indiana Gamero.  (son)   Hobbies:  none   Outpatient Encounter Medications as of 01/21/2018  Medication Sig  . acetaminophen (TYLENOL) 500 MG tablet Take 500 mg by mouth 2 (two) times daily.  . Amino Acids-Protein Hydrolys (FEEDING SUPPLEMENT, PRO-STAT SUGAR FREE 64,) LIQD Take 30 mLs by mouth 2 (two) times daily.  Marland Kitchen apixaban (ELIQUIS) 5 MG TABS tablet Take 1 tablet (5 mg total) by mouth 2 (two) times daily.  Marland Kitchen atorvastatin (LIPITOR) 10 MG tablet Take 10 mg by mouth daily at 6 PM.   . Fish Oil OIL Take 1,000 mg by mouth 2 (two) times daily.  . metoprolol succinate (TOPROL XL) 25 MG 24 hr tablet Take 1 tablet (25 mg total) by mouth 2 (two) times daily.  Marland Kitchen  Mirtazapine (REMERON PO) Take by mouth.  . Multiple Vitamins-Minerals (THERA M PLUS PO) Take 1 tablet by mouth daily.  . potassium chloride SA (K-DUR,KLOR-CON) 20 MEQ tablet Take 1 tablet (20 mEq total) by mouth 2 (two) times daily.  Marland Kitchen thiamine 100 MG tablet Take 1 tablet (100 mg total) by mouth daily.  . Vitamin D, Ergocalciferol, (DRISDOL) 50000 units CAPS capsule Take 50,000 Units by mouth every 7 (seven) days.   No facility-administered encounter medications on file as of 01/21/2018.    ALLERGIES: Allergies  Allergen Reactions  . Ativan [Lorazepam]   . Ciprofloxacin   . Penicillins     VACCINATION STATUS:  There is no immunization history on file for this patient.  HPI 72 year old female patient with multiple medical problems as above. She is being seen in f/u for history of  hyperthyroidism and nodular goiter . - She was briefly treated with Tapazole for hyperthyroidism to which  she quickly responded until he was discontinued during her prior visit.   -She is returning for follow-up with repeat thyroid function tests.  -She is a poor historian,  nursing home resident due to failure to thrive,  does not recall the details of her history. .  - She denies dysphagia, shortness of breath. - Her history includes long-standing history of alcohol abuse complicated by alcoholic hepatitis.  Review of Systems  Constitutional: + Steady weight, + wheelchair-bound due to deconditioning and disequilibrium Eyes: no blurry vision, no xerophthalmia ENT: no sore throat, no nodules palpated in throat, no dysphagia/odynophagia, no hoarseness  Skin: no rashes Neurological: no tremors, no numbness, no tingling, no dizziness Psychiatric: no depression, no anxiety  Objective:    BP 128/75   Pulse 92   Wt Readings from Last 3 Encounters:  12/29/16 172 lb (78 kg)  07/01/16 171 lb (77.6 kg)  02/19/16 168 lb (76.2 kg)    Physical Exam  Constitutional: + Wheelchair-bound  , not in acute distress,  Eyes: PERRLA, EOMI, no exophthalmos ENT: moist mucous membranes, No thyromegaly, no cervical lymphadenopathy Musculoskeletal: no gross deformities, strength intact in all four extremities Skin: moist, warm, no rashes Neurological: - gross  tremor with outstretched hands  CMP ( most recent) CMP     Component Value Date/Time   NA 142 02/13/2016 0855   K 4.0 02/13/2016 0855   CL 107 02/13/2016 0855   CO2 28 02/13/2016 0855   GLUCOSE 86 02/13/2016 0855   BUN 18 02/13/2016 0855   CREATININE 0.96 02/13/2016 0855   CALCIUM 9.1 02/13/2016 0855   PROT 6.7 07/06/2013 0912   ALBUMIN 2.7 (L) 07/06/2013 0912   AST 85 (H) 07/06/2013 0912   ALT 53 (H) 07/06/2013 0912   ALKPHOS 114 07/06/2013 0912   BILITOT 1.1 07/06/2013 0912   GFRNONAA 60 (L) 02/13/2016 0855   GFRAA >60 02/13/2016 0855    -Labs from September 29, 2017 showed TSH 2.1 1, free T4 0.9 6 Labs from 08/29/2017: TSH 2.heat,  free T4 0.9 Labs from 06/30/2017: TSH 5.34, free T4 0.73, free T3 3.1. 04/27/2017 :  free T4 1.86 05/20/2017 : TSH 0.02, free T4 1.09    Assessment & Plan:   1. Hyperthyroidism -Although her labs were done too soon, she remains clinically euthyroid.  I advised her to remain off of therapy with Tapazole for now.   She will have repeat thyroid function test in 6 months with office visit as needed.   2. Multinodular goiter -On June 16, 2017, her thyroid/neck ultrasound is unremarkable,  no nodular lesions.  - I advised patient to maintain close follow up with Jani Gravel, MD for primary care needs.  Follow up plan: Return in about 6 months (around 07/24/2018), or if symptoms worsen or fail to improve.  Glade Lloyd, MD Phone: [phone]  Fax: [phone]   This note was partially dictated with voice recognition software. Similar sounding words can be transcribed inadequately or may not  be corrected upon review.  01/21/2018, 5:26 PM

## 2018-06-08 LAB — VITAMIN B12: VITAMIN B 12: 516

## 2018-06-08 LAB — BASIC METABOLIC PANEL
BUN: 17 (ref 4–21)
Creatinine: 1 (ref 0.5–1.1)

## 2018-07-20 LAB — VITAMIN D 25 HYDROXY (VIT D DEFICIENCY, FRACTURES): VIT D 25 HYDROXY: 49

## 2018-07-20 LAB — LIPID PANEL
CHOLESTEROL: 159 (ref 0–200)
HDL: 70 (ref 35–70)
LDL Cholesterol: 76
Triglycerides: 62 (ref 40–160)

## 2018-07-20 LAB — HEMOGLOBIN A1C: Hgb A1c MFr Bld: 5 (ref 4.0–6.0)

## 2018-07-20 LAB — TSH: TSH: 1.8 (ref 0.41–5.90)

## 2018-07-26 ENCOUNTER — Ambulatory Visit: Payer: Medicare Other | Admitting: "Endocrinology

## 2018-08-18 ENCOUNTER — Ambulatory Visit (INDEPENDENT_AMBULATORY_CARE_PROVIDER_SITE_OTHER): Payer: Medicare Other | Admitting: "Endocrinology

## 2018-08-18 ENCOUNTER — Encounter: Payer: Self-pay | Admitting: "Endocrinology

## 2018-08-18 VITALS — BP 137/83 | HR 61

## 2018-08-18 DIAGNOSIS — E0590 Thyrotoxicosis, unspecified without thyrotoxic crisis or storm: Secondary | ICD-10-CM | POA: Diagnosis not present

## 2018-08-18 NOTE — Progress Notes (Signed)
Endocrinology follow-up note  Subjective:    Patient ID: Sarah Parsons, female    DOB: 1946/09/07, PCP Jani Gravel, MD   Past Medical History:  Diagnosis Date  . 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 Alcoholic hepatitis 12/25/9796  . Alcoholism /alcohol abuse ([place])   . Anemia   . Cholelithiasis 11/17/2012  . Fatty liver 11/17/2012  . FTT (failure to thrive) in adult 11/16/2012  . Hypercholesteremia   . Hypertension   . Tobacco abuse   . UTI (urinary tract infection) 11/17/2012   Past Surgical History:  Procedure Laterality Date  . COLONOSCOPY WITH PROPOFOL N/A 02/19/2016   Procedure: COLONOSCOPY WITH PROPOFOL;  Surgeon: Danie Binder, MD;  [facility];  Service: Endoscopy;  Laterality: N/A;  0930 - moved to 4/18 @ 9:00 - office notified pt   Social History   Socioeconomic History  . Marital status: Married    Spouse name: Clinical research associate Sr.   . Number of children: 2  . Years of education: 5   . Highest education level: Not on file  Occupational History  . Not on file  Social Needs  . Financial resource strain: Not on file  . Food insecurity:    Worry: Not on file    Inability: Not on file  . Transportation needs:    Medical: Not on file    Non-medical: Not on file  Tobacco Use  . Smoking status: Former Smoker    Packs/day: 1.00    Years: 40.00    Pack years: 40.00    Types: Cigarettes    Start date: 06/26/1960    Last attempt to quit: 06/28/2013    Years since quitting: 5.1  . Smokeless tobacco: Never Used  Substance and Sexual Activity  . Alcohol use: No    Alcohol/week: 0.0 standard drinks    Comment: Not currently; Last ETOH 10/2014, previously 1/2 pint daily for 8-10 years.  . Drug use: No  . Sexual activity: Never  Lifestyle  . Physical activity:    Days per week: Not on file    Minutes per session: Not on file  . Stress: Not on file  Relationships  . Social connections:    Talks on  phone: Not on file    Gets together: Not on file    Attends religious service: Not on file    Active member of club or organization: Not on file    Attends meetings of clubs or organizations: Not on file    Relationship status: Not on file  Other Topics Concern  . Not on file  Social History Narrative   Born: Therapist, music   Occupation: ?   Children:  2 ,  [name] (daughter), and Treazure Nery.  (son)   Hobbies:  none   Outpatient Encounter Medications as of 08/18/2018  Medication Sig  . acetaminophen (TYLENOL) 500 MG tablet Take 500 mg by mouth 2 (two) times daily.  . Amino Acids-Protein Hydrolys (FEEDING SUPPLEMENT, PRO-STAT SUGAR FREE 64,) LIQD Take 30 mLs by mouth 2 (two) times daily.  Marland Kitchen apixaban (ELIQUIS) 5 MG TABS tablet Take 1 tablet (5 mg total) by mouth 2 (two) times daily.  Marland Kitchen atorvastatin (LIPITOR) 10 MG tablet Take 10 mg by mouth daily at 6 PM.   . Fish Oil OIL Take 1,000 mg by mouth 2 (two) times daily.  . metoprolol succinate (TOPROL XL) 25 MG 24 hr tablet Take 1 tablet (25 mg total) by  mouth 2 (two) times daily.  . Mirtazapine (REMERON PO) Take by mouth.  . Multiple Vitamins-Minerals (THERA M PLUS PO) Take 1 tablet by mouth daily.  . potassium chloride SA (K-DUR,KLOR-CON) 20 MEQ tablet Take 1 tablet (20 mEq total) by mouth 2 (two) times daily.  Marland Kitchen thiamine 100 MG tablet Take 1 tablet (100 mg total) by mouth daily.  . Vitamin D, Ergocalciferol, (DRISDOL) 50000 units CAPS capsule Take 50,000 Units by mouth every 7 (seven) days.   No facility-administered encounter medications on file as of 08/18/2018.    ALLERGIES: Allergies  Allergen Reactions  . Ativan [Lorazepam]   . Ciprofloxacin   . Penicillins     VACCINATION STATUS:  There is no immunization history on file for this patient.  HPI 72 year old female patient with multiple medical problems as above. She is being seen in f/u for history of  hyperthyroidism and nodular goiter . - She was briefly  treated with Tapazole for hyperthyroidism to which she quickly responded. -She is currently off of Tapazole. -She is returning for follow-up with repeat thyroid function tests.  -She is a poor historian,  nursing home resident due to failure to thrive,  does not recall the details of her history.   - She denies dysphagia, shortness of breath. - Her history includes long-standing history of alcohol abuse complicated by alcoholic hepatitis.  Review of Systems  Constitutional: + Steady weight, + wheelchair-bound due to deconditioning and disequilibrium Eyes: no blurry vision, no xerophthalmia ENT: no sore throat, no nodules palpated in throat, no dysphagia/odynophagia, no hoarseness  Skin: no rashes Neurological: no tremors, no numbness, no tingling, no dizziness Psychiatric: no depression, no anxiety  Objective:    BP 137/83   Pulse 61   Wt Readings from Last 3 Encounters:  12/29/16 172 lb (78 kg)  07/01/16 171 lb (77.6 kg)  02/19/16 168 lb (76.2 kg)    Physical Exam  Constitutional: + Wheelchair-bound  , not in acute distress,  Eyes: PERRLA, EOMI, no exophthalmos ENT: moist mucous membranes, No thyromegaly, no cervical lymphadenopathy Musculoskeletal: no gross deformities, strength intact in all four extremities Skin: moist, warm, no rashes Neurological: - gross  tremor with outstretched hands  CMP ( most recent) CMP     Component Value Date/Time   NA 142 02/13/2016 0855   K 4.0 02/13/2016 0855   CL 107 02/13/2016 0855   CO2 28 02/13/2016 0855   GLUCOSE 86 02/13/2016 0855   BUN 17 06/08/2018   CREATININE 1.0 06/08/2018   CREATININE 0.96 02/13/2016 0855   CALCIUM 9.1 02/13/2016 0855   PROT 6.7 07/06/2013 0912   ALBUMIN 2.7 (L) 07/06/2013 0912   AST 85 (H) 07/06/2013 0912   ALT 53 (H) 07/06/2013 0912   ALKPHOS 114 07/06/2013 0912   BILITOT 1.1 07/06/2013 0912   GFRNONAA 60 (L) 02/13/2016 0855   GFRAA >60 02/13/2016 0855   Recent Results (from the past 2160  hour(s))  Basic metabolic panel     Status: None   Collection Time: 06/08/18 12:00 AM  Result Value Ref Range   BUN 17 4 - 21   Creatinine 1.0 0.5 - 1.1  Vitamin B12     Status: None   Collection Time: 06/08/18 12:00 AM  Result Value Ref Range   Vitamin B-12 516   VITAMIN D 25 Hydroxy (Vit-D Deficiency, Fractures)     Status: None   Collection Time: 07/20/18 12:00 AM  Result Value Ref Range   Vit D, 25-Hydroxy 49  Lipid panel     Status: None   Collection Time: 07/20/18 12:00 AM  Result Value Ref Range   Triglycerides 62 40 - 160   Cholesterol 159 0 - 200   HDL 70 35 - 70   LDL Cholesterol 76   Hemoglobin A1c     Status: None   Collection Time: 07/20/18 12:00 AM  Result Value Ref Range   Hgb A1c MFr Bld 5.0 4.0 - 6.0  TSH     Status: None   Collection Time: 07/20/18 12:00 AM  Result Value Ref Range   TSH 1.80 0.41 - 5.90    Assessment & Plan:   1. H/o  Hyperthyroidism -She is status post treatment with Tapazole for hyperthyroidism.  She is currently clinically and biochemically euthyroid.  She will not need any antithyroid medications nor thyroid hormone supplements.  She will have repeat thyroid function test in 6 months with office visit as needed.   2. Multinodular goiter -On June 16, 2017, her thyroid/neck ultrasound is unremarkable, no nodular lesions.  - I advised patient to maintain close follow up with Jani Gravel, MD for primary care needs.  Follow up plan: Return in about 6 months (around 02/17/2019) for Follow up with Pre-visit Labs.  Glade Lloyd, MD Phone: [phone]  Fax: [phone]   This note was partially dictated with voice recognition software. Similar sounding words can be transcribed inadequately or may not  be corrected upon review.  08/18/2018, 11:32 AM

## 2018-08-27 IMAGING — US US SOFT TISSUE HEAD/NECK
1 series · 14 of 25 positions shown · non-contrast
Comparison: None.

CLINICAL DATA: Goiter.  Hyperthyroidism.

EXAM:
THYROID ULTRASOUND
TECHNIQUE: Ultrasound examination of the thyroid gland and adjacent soft
tissues was performed.

[Series 1: us soft tissue head/neck · 0.06mm/px · 14 of 45 slices shown]
[im 1/45]
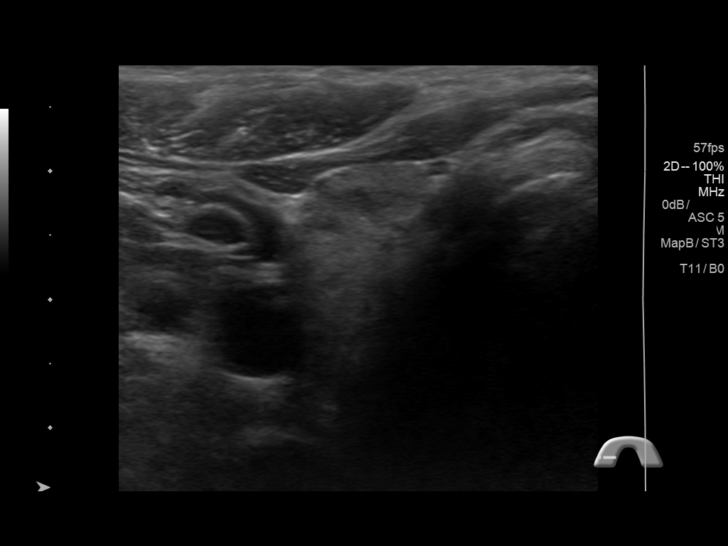
[im 4/45]
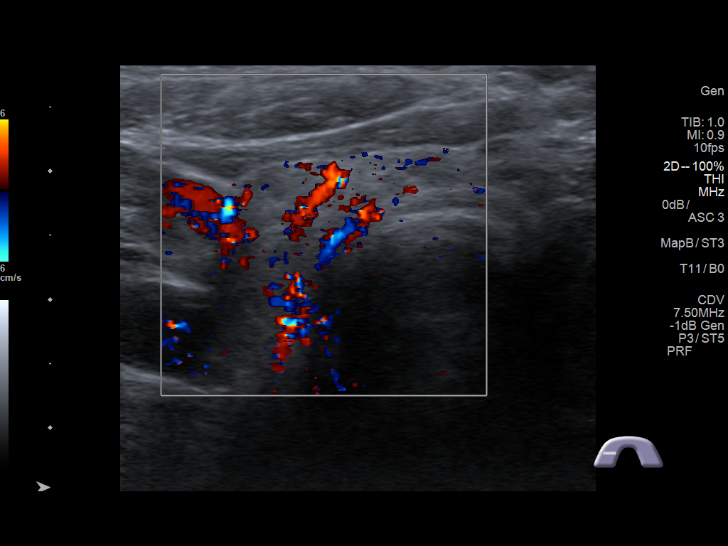
[im 8/45]
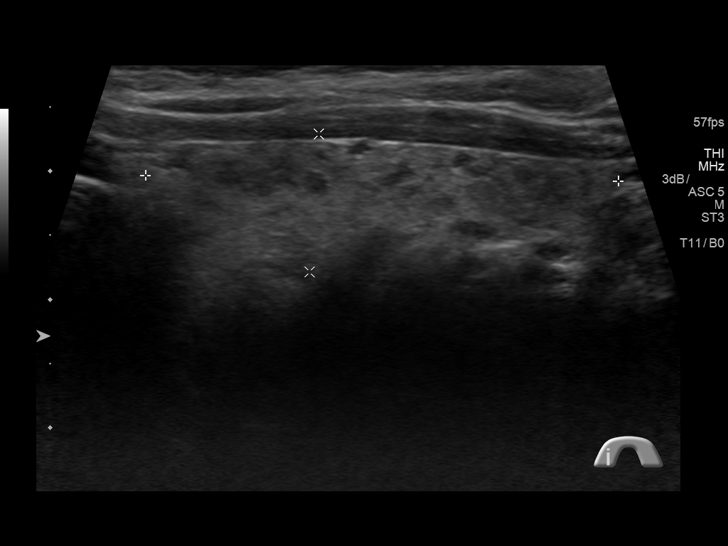
[im 12/45]
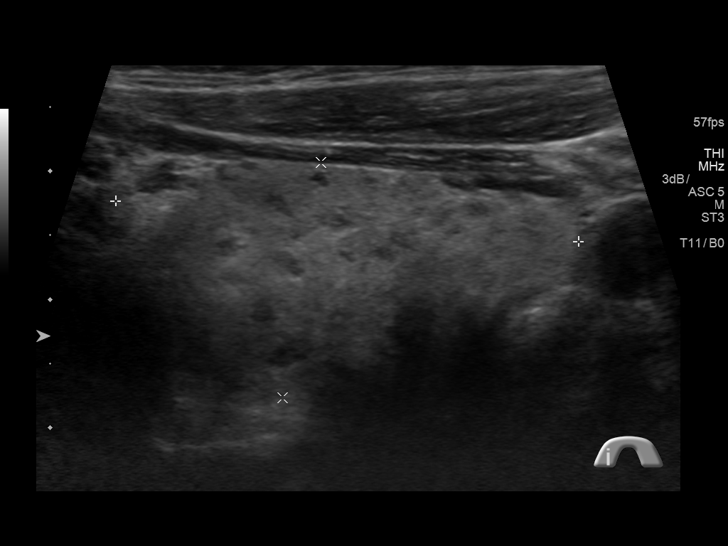
[im 15/45]
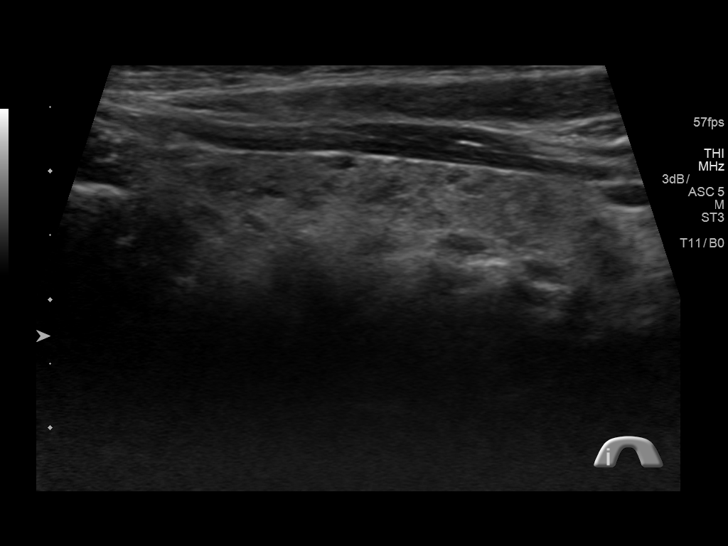
[im 17/45]
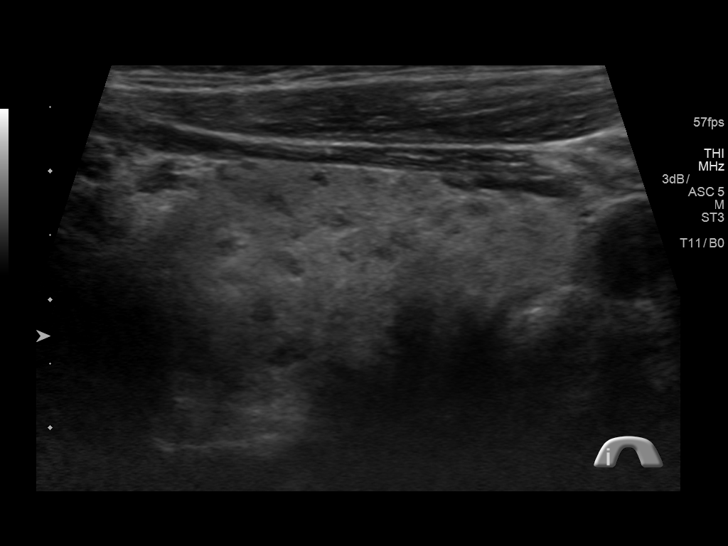
[im 21/45]
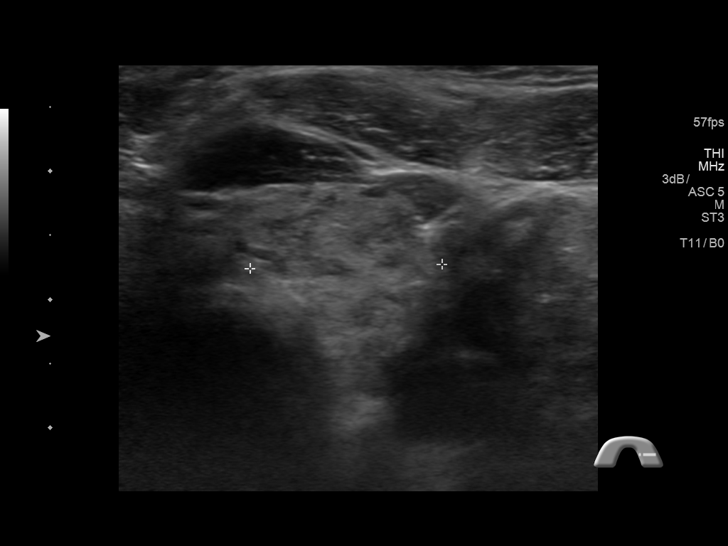
[im 24/45]
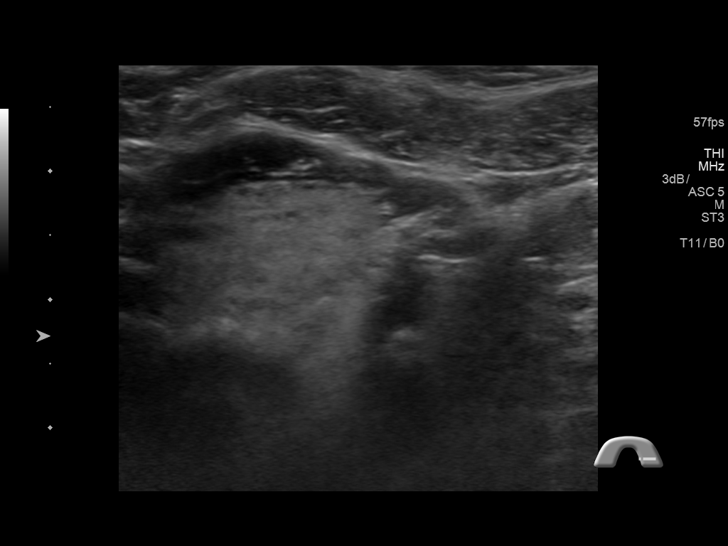
[im 28/45]
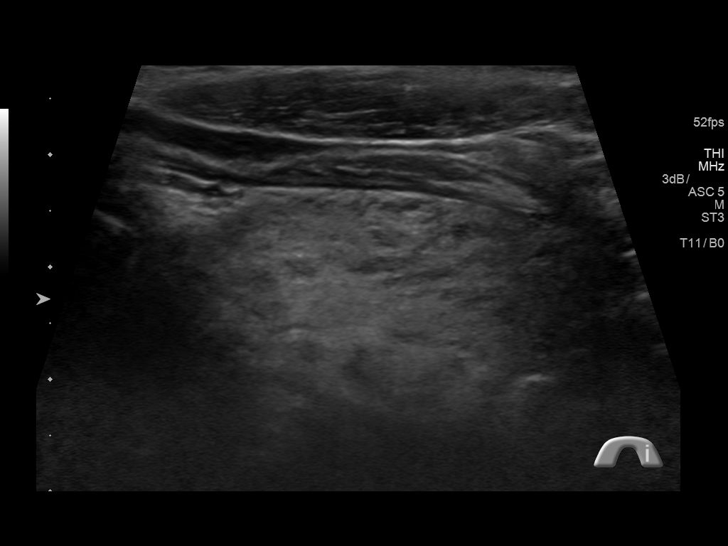
[im 30/45]
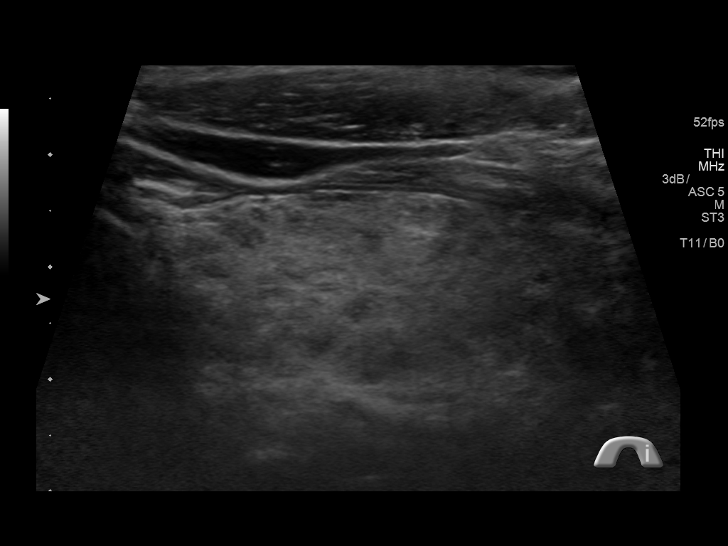
[im 34/45]
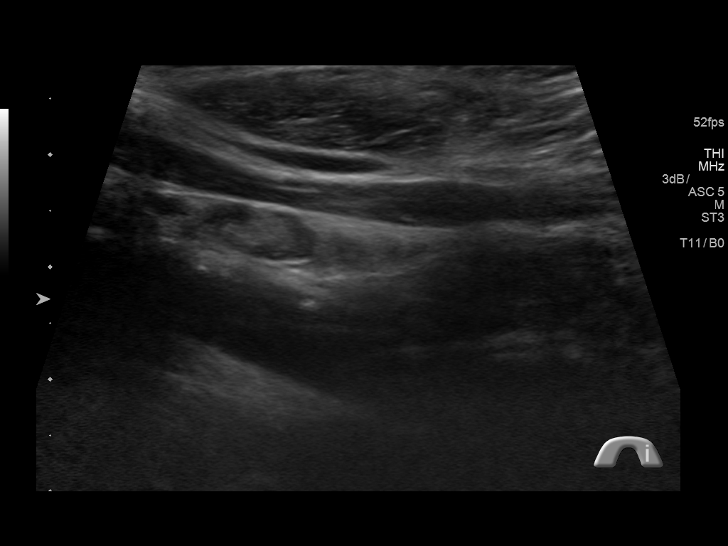
[im 37/45]
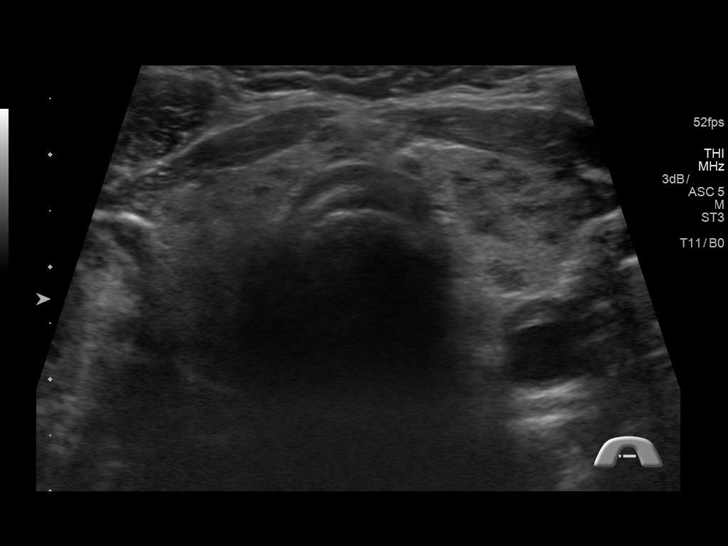
[im 41/45]
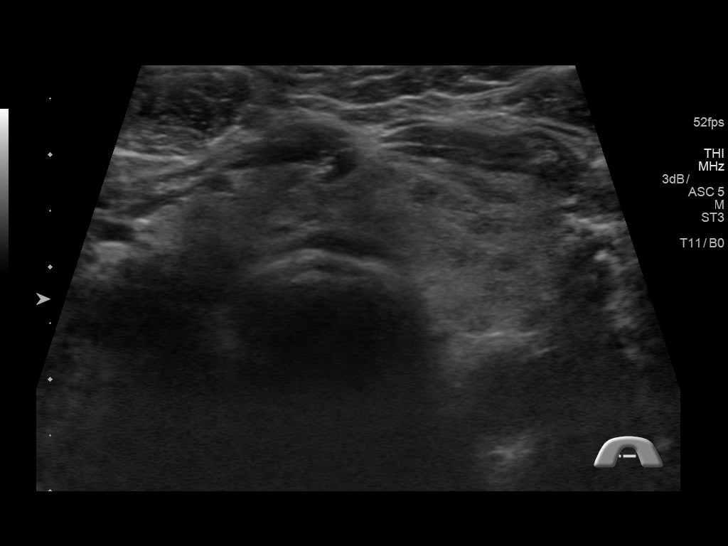
[im 45/45]
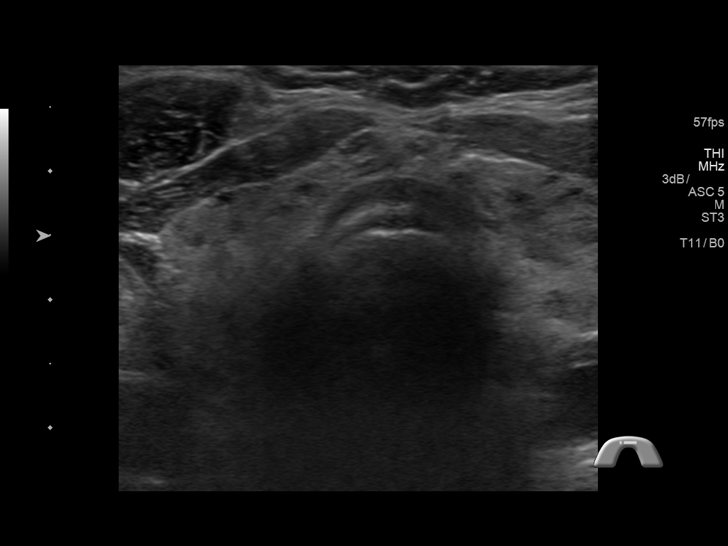

[14 of 25 positions shown; findings below may reference images not displayed]

FINDINGS: Parenchymal Echotexture: Markedly heterogenous

Isthmus: 0.4 cm

Right lobe: 3.6 x 1.9 x 1.3 cm

Left lobe: 4.0 x 1.8 x 1.5 cm

_________________________________________________________

Estimated total number of nodules >/= 1 cm: 0

Number of spongiform nodules >/=  2 cm not described below (TR1): 0

Number of mixed cystic and solid nodules >/= 1.5 cm not described
below (TR2): 0

_________________________________________________________

No discrete nodules are seen within the thyroid gland.
IMPRESSION: Heterogeneous glandular tissue without focal nodule. This study does
not meet criteria for biopsy or follow-up.

The above is in keeping with the ACR TI-RADS recommendations - [HOSPITAL] 8001;[DATE].

## 2018-09-03 DIAGNOSIS — M6281 Muscle weakness (generalized): Secondary | ICD-10-CM | POA: Diagnosis not present

## 2018-09-03 DIAGNOSIS — R2681 Unsteadiness on feet: Secondary | ICD-10-CM | POA: Diagnosis not present

## 2018-09-06 DIAGNOSIS — M6281 Muscle weakness (generalized): Secondary | ICD-10-CM | POA: Diagnosis not present

## 2018-09-06 DIAGNOSIS — R2681 Unsteadiness on feet: Secondary | ICD-10-CM | POA: Diagnosis not present

## 2018-09-07 DIAGNOSIS — M6281 Muscle weakness (generalized): Secondary | ICD-10-CM | POA: Diagnosis not present

## 2018-09-07 DIAGNOSIS — R2681 Unsteadiness on feet: Secondary | ICD-10-CM | POA: Diagnosis not present

## 2018-09-08 DIAGNOSIS — M6281 Muscle weakness (generalized): Secondary | ICD-10-CM | POA: Diagnosis not present

## 2018-09-08 DIAGNOSIS — R2681 Unsteadiness on feet: Secondary | ICD-10-CM | POA: Diagnosis not present

## 2018-09-09 DIAGNOSIS — R2681 Unsteadiness on feet: Secondary | ICD-10-CM | POA: Diagnosis not present

## 2018-09-09 DIAGNOSIS — M6281 Muscle weakness (generalized): Secondary | ICD-10-CM | POA: Diagnosis not present

## 2018-09-10 DIAGNOSIS — R2681 Unsteadiness on feet: Secondary | ICD-10-CM | POA: Diagnosis not present

## 2018-09-10 DIAGNOSIS — M6281 Muscle weakness (generalized): Secondary | ICD-10-CM | POA: Diagnosis not present

## 2018-09-13 DIAGNOSIS — M6281 Muscle weakness (generalized): Secondary | ICD-10-CM | POA: Diagnosis not present

## 2018-09-13 DIAGNOSIS — R2681 Unsteadiness on feet: Secondary | ICD-10-CM | POA: Diagnosis not present

## 2018-09-14 DIAGNOSIS — M6281 Muscle weakness (generalized): Secondary | ICD-10-CM | POA: Diagnosis not present

## 2018-09-14 DIAGNOSIS — R2681 Unsteadiness on feet: Secondary | ICD-10-CM | POA: Diagnosis not present

## 2018-09-22 DIAGNOSIS — I739 Peripheral vascular disease, unspecified: Secondary | ICD-10-CM | POA: Diagnosis not present

## 2018-09-22 DIAGNOSIS — L84 Corns and callosities: Secondary | ICD-10-CM | POA: Diagnosis not present

## 2018-11-30 DIAGNOSIS — Z79899 Other long term (current) drug therapy: Secondary | ICD-10-CM | POA: Diagnosis not present

## 2018-11-30 DIAGNOSIS — R531 Weakness: Secondary | ICD-10-CM | POA: Diagnosis not present

## 2018-12-02 DIAGNOSIS — I4891 Unspecified atrial fibrillation: Secondary | ICD-10-CM | POA: Diagnosis not present

## 2018-12-02 DIAGNOSIS — Z7901 Long term (current) use of anticoagulants: Secondary | ICD-10-CM | POA: Diagnosis not present

## 2018-12-02 DIAGNOSIS — D649 Anemia, unspecified: Secondary | ICD-10-CM | POA: Diagnosis not present

## 2018-12-08 DIAGNOSIS — E039 Hypothyroidism, unspecified: Secondary | ICD-10-CM | POA: Diagnosis not present

## 2018-12-08 DIAGNOSIS — N183 Chronic kidney disease, stage 3 (moderate): Secondary | ICD-10-CM | POA: Diagnosis not present

## 2018-12-08 DIAGNOSIS — D649 Anemia, unspecified: Secondary | ICD-10-CM | POA: Diagnosis not present

## 2018-12-08 DIAGNOSIS — E119 Type 2 diabetes mellitus without complications: Secondary | ICD-10-CM | POA: Diagnosis not present

## 2018-12-08 DIAGNOSIS — E559 Vitamin D deficiency, unspecified: Secondary | ICD-10-CM | POA: Diagnosis not present

## 2018-12-08 DIAGNOSIS — E785 Hyperlipidemia, unspecified: Secondary | ICD-10-CM | POA: Diagnosis not present

## 2018-12-08 DIAGNOSIS — I4891 Unspecified atrial fibrillation: Secondary | ICD-10-CM | POA: Diagnosis not present

## 2018-12-08 DIAGNOSIS — F329 Major depressive disorder, single episode, unspecified: Secondary | ICD-10-CM | POA: Diagnosis not present

## 2018-12-13 DIAGNOSIS — D649 Anemia, unspecified: Secondary | ICD-10-CM | POA: Diagnosis not present

## 2018-12-13 DIAGNOSIS — E785 Hyperlipidemia, unspecified: Secondary | ICD-10-CM | POA: Diagnosis not present

## 2018-12-13 DIAGNOSIS — E039 Hypothyroidism, unspecified: Secondary | ICD-10-CM | POA: Diagnosis not present

## 2018-12-15 DIAGNOSIS — B351 Tinea unguium: Secondary | ICD-10-CM | POA: Diagnosis not present

## 2018-12-15 DIAGNOSIS — M79675 Pain in left toe(s): Secondary | ICD-10-CM | POA: Diagnosis not present

## 2019-01-04 ENCOUNTER — Ambulatory Visit (INDEPENDENT_AMBULATORY_CARE_PROVIDER_SITE_OTHER): Payer: Medicare Other | Admitting: Cardiovascular Disease

## 2019-01-04 ENCOUNTER — Encounter: Payer: Self-pay | Admitting: Cardiovascular Disease

## 2019-01-04 VITALS — BP 116/70 | HR 75 | Ht 66.0 in | Wt 160.0 lb

## 2019-01-04 DIAGNOSIS — I480 Paroxysmal atrial fibrillation: Secondary | ICD-10-CM | POA: Diagnosis not present

## 2019-01-04 DIAGNOSIS — E7800 Pure hypercholesterolemia, unspecified: Secondary | ICD-10-CM | POA: Diagnosis not present

## 2019-01-04 DIAGNOSIS — I10 Essential (primary) hypertension: Secondary | ICD-10-CM

## 2019-01-04 DIAGNOSIS — Z7901 Long term (current) use of anticoagulants: Secondary | ICD-10-CM

## 2019-01-04 NOTE — Progress Notes (Signed)
SUBJECTIVE:  The patient presents for annual follow-up of paroxysmal atrial fibrillation.  Echocardiogram 12/18/15: Normal left ventricular systolicand diastolicfunction and regional wall motion, LVEF 65-70%, mild LVH.  The patient denies any symptoms of chest pain, palpitations, shortness of breath, lightheadedness, dizziness, leg swelling, orthopnea, PND, and syncope.  She is here with an Environmental consultant from her living facility.    Review of Systems: As per "subjective", otherwise negative.  Allergies  Allergen Reactions  . Ativan [Lorazepam]   . Ciprofloxacin   . Penicillins     Current Outpatient Medications  Medication Sig Dispense Refill  . acetaminophen (TYLENOL) 500 MG tablet Take 500 mg by mouth 2 (two) times daily.    . Amino Acids-Protein Hydrolys (FEEDING SUPPLEMENT, PRO-STAT SUGAR FREE 64,) LIQD Take 30 mLs by mouth 2 (two) times daily.    Marland Kitchen apixaban (ELIQUIS) 5 MG TABS tablet Take 1 tablet (5 mg total) by mouth 2 (two) times daily.    Marland Kitchen atorvastatin (LIPITOR) 10 MG tablet Take 10 mg by mouth daily at 6 PM.     . Fish Oil OIL Take 1,000 mg by mouth 2 (two) times daily.    . metoprolol succinate (TOPROL XL) 25 MG 24 hr tablet Take 1 tablet (25 mg total) by mouth 2 (two) times daily.    . Mirtazapine (REMERON PO) Take 7.5 mg by mouth.     . Multiple Vitamins-Minerals (THERA M PLUS PO) Take 1 tablet by mouth daily.    . potassium chloride SA (K-DUR,KLOR-CON) 20 MEQ tablet Take 1 tablet (20 mEq total) by mouth 2 (two) times daily. 8 tablet 0  . thiamine 100 MG tablet Take 1 tablet (100 mg total) by mouth daily.    . Vitamin D, Ergocalciferol, (DRISDOL) 50000 units CAPS capsule Take 50,000 Units by mouth every 7 (seven) days.     No current facility-administered medications for this visit.     Past Medical History:  Diagnosis Date  . 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 Alcoholic hepatitis 7/67/3419  . Alcoholism  /alcohol abuse ([place])   . Anemia   . Cholelithiasis 11/17/2012  . Fatty liver 11/17/2012  . FTT (failure to thrive) in adult 11/16/2012  . Hypercholesteremia   . Hypertension   . Tobacco abuse   . UTI (urinary tract infection) 11/17/2012    Past Surgical History:  Procedure Laterality Date  . COLONOSCOPY WITH PROPOFOL N/A 02/19/2016   Procedure: COLONOSCOPY WITH PROPOFOL;  Surgeon: Danie Binder, MD;  [facility];  Service: Endoscopy;  Laterality: N/A;  0930 - moved to 4/18 @ 9:00 - office notified pt    Social History   Socioeconomic History  . Marital status: Married    Spouse name: Clinical research associate Sr.   . Number of children: 2  . Years of education: 6   . Highest education level: Not on file  Occupational History  . Not on file  Social Needs  . Financial resource strain: Not on file  . Food insecurity:    Worry: Not on file    Inability: Not on file  . Transportation needs:    Medical: Not on file    Non-medical: Not on file  Tobacco Use  . Smoking status: Former Smoker    Packs/day: 1.00    Years: 40.00    Pack years: 40.00    Types: Cigarettes    Start date: 06/26/1960    Last attempt to quit: 06/28/2013  Years since quitting: 5.5  . Smokeless tobacco: Never Used  Substance and Sexual Activity  . Alcohol use: No    Alcohol/week: 0.0 standard drinks    Comment: Not currently; Last ETOH 10/2014, previously 1/2 pint daily for 8-10 years.  . Drug use: No  . Sexual activity: Never  Lifestyle  . Physical activity:    Days per week: Not on file    Minutes per session: Not on file  . Stress: Not on file  Relationships  . Social connections:    Talks on phone: Not on file    Gets together: Not on file    Attends religious service: Not on file    Active member of club or organization: Not on file    Attends meetings of clubs or organizations: Not on file    Relationship status: Not on file  . Intimate partner violence:    Fear of current or ex  partner: Not on file    Emotionally abused: Not on file    Physically abused: Not on file    Forced sexual activity: Not on file  Other Topics Concern  . Not on file  Social History Narrative   Born: Therapist, music   Occupation: ?   Children:  2 ,  [name] (daughter), and Elvera Almario.  (son)   Hobbies:  none     Vitals:   01/04/19 1103  BP: 116/70  Pulse: 75  SpO2: 99%  Weight: 160 lb (72.6 kg)  Height: 5\' 6"  (1.676 m)    Wt Readings from Last 3 Encounters:  01/04/19 160 lb (72.6 kg)  12/29/16 172 lb (78 kg)  07/01/16 171 lb (77.6 kg)     PHYSICAL EXAM General: NAD HEENT: Tremor noted. Neck: No JVD, no thyromegaly. Lungs: Clear to auscultation bilaterally with normal respiratory effort. CV: Regular rate and rhythm, normal S1/S2, no S3/S4, no murmur. No pretibial or periankle edema.  No carotid bruit.   Abdomen: Soft, nontender, no distention.  Neurologic: Alert and oriented.  Psych: Normal affect. Skin: Normal. Musculoskeletal: No gross deformities.    ECG: Reviewed above under Subjective   Labs: Lab Results  Component Value Date/Time   K 4.0 02/13/2016 08:55 AM   BUN 17 06/08/2018   CREATININE 1.0 06/08/2018   CREATININE 0.96 02/13/2016 08:55 AM   ALT 53 (H) 07/06/2013 09:12 AM   TSH 1.80 07/20/2018   TSH 3.115 07/05/2013 07:33 PM   HGB 12.9 02/13/2016 08:55 AM     Lipids: Lab Results  Component Value Date/Time   LDLCALC 76 07/20/2018   CHOL 159 07/20/2018   TRIG 62 07/20/2018   HDL 70 07/20/2018       ASSESSMENT AND PLAN:  1.Paroxysmalatrial fibrillation: Symptomatically stableon Toprol XL 25 mg bid. CHA2DS2VASc score is 3 thus elevated thromboembolic risk. Will continue Eliquis 5 mg bid.  2. Essential HTN: Controlled on present therapy. No changes.  3. Hyperlipidemia: On Lipitor 10 mg.   Disposition: Follow up 1 yr   Kate Sable, M.D., F.A.C.C.

## 2019-01-04 NOTE — Patient Instructions (Signed)
Medication Instructions:  Your physician recommends that you continue on your current medications as directed. Please refer to the Current Medication list given to you today.  If you need a refill on your cardiac medications before your next appointment, please call your pharmacy.   Lab work: NONE  If you have labs (blood work) drawn today and your tests are completely normal, you will receive your results only by: Marland Kitchen MyChart Message (if you have MyChart) OR . A paper copy in the mail If you have any lab test that is abnormal or we need to change your treatment, we will call you to review the results.  Testing/Procedures: NONE   Follow-Up: At [facility], you and your health needs are our priority.  As part of our continuing mission to provide you with exceptional heart care, we have created designated Provider Care Teams.  These Care Teams include your primary Cardiologist (physician) and Advanced Practice Providers (APPs -  Physician Assistants and Nurse Practitioners) who all work together to provide you with the care you need, when you need it. You will need a follow up appointment in 1 years.  Please call our office 2 months in advance to schedule this appointment.  You may see Kate Sable, MD or one of the following Advanced Practice Providers on your designated Care Team:   Bernerd Pho, PA-C [facility]) . Ermalinda Barrios, PA-C ([facility])  Any Other Special Instructions Will Be Listed Below (If Applicable). Thank you for choosing [facility]!

## 2019-01-13 DIAGNOSIS — R0989 Other specified symptoms and signs involving the circulatory and respiratory systems: Secondary | ICD-10-CM | POA: Diagnosis not present

## 2019-01-13 DIAGNOSIS — I10 Essential (primary) hypertension: Secondary | ICD-10-CM | POA: Diagnosis not present

## 2019-01-13 DIAGNOSIS — R627 Adult failure to thrive: Secondary | ICD-10-CM | POA: Diagnosis not present

## 2019-01-13 DIAGNOSIS — D649 Anemia, unspecified: Secondary | ICD-10-CM | POA: Diagnosis not present

## 2019-02-17 ENCOUNTER — Ambulatory Visit: Payer: Medicare Other | Admitting: "Endocrinology

## 2019-02-17 DIAGNOSIS — E785 Hyperlipidemia, unspecified: Secondary | ICD-10-CM | POA: Diagnosis not present

## 2019-02-17 DIAGNOSIS — I4891 Unspecified atrial fibrillation: Secondary | ICD-10-CM | POA: Diagnosis not present

## 2019-02-17 DIAGNOSIS — Z7901 Long term (current) use of anticoagulants: Secondary | ICD-10-CM | POA: Diagnosis not present

## 2019-02-17 DIAGNOSIS — E039 Hypothyroidism, unspecified: Secondary | ICD-10-CM | POA: Diagnosis not present

## 2019-03-09 ENCOUNTER — Ambulatory Visit: Payer: Medicare Other | Admitting: "Endocrinology

## 2019-03-30 DIAGNOSIS — N183 Chronic kidney disease, stage 3 (moderate): Secondary | ICD-10-CM | POA: Diagnosis not present

## 2019-03-30 DIAGNOSIS — D649 Anemia, unspecified: Secondary | ICD-10-CM | POA: Diagnosis not present

## 2019-03-30 DIAGNOSIS — E119 Type 2 diabetes mellitus without complications: Secondary | ICD-10-CM | POA: Diagnosis not present

## 2019-03-30 DIAGNOSIS — E039 Hypothyroidism, unspecified: Secondary | ICD-10-CM | POA: Diagnosis not present

## 2019-03-30 DIAGNOSIS — E0590 Thyrotoxicosis, unspecified without thyrotoxic crisis or storm: Secondary | ICD-10-CM | POA: Diagnosis not present

## 2019-04-06 ENCOUNTER — Ambulatory Visit: Payer: Medicare Other | Admitting: "Endocrinology

## 2019-04-06 ENCOUNTER — Other Ambulatory Visit: Payer: Self-pay

## 2019-04-11 DIAGNOSIS — F0390 Unspecified dementia without behavioral disturbance: Secondary | ICD-10-CM | POA: Diagnosis not present

## 2019-04-20 DIAGNOSIS — E785 Hyperlipidemia, unspecified: Secondary | ICD-10-CM | POA: Diagnosis not present

## 2019-04-20 DIAGNOSIS — I4891 Unspecified atrial fibrillation: Secondary | ICD-10-CM | POA: Diagnosis not present

## 2019-04-20 DIAGNOSIS — E559 Vitamin D deficiency, unspecified: Secondary | ICD-10-CM | POA: Diagnosis not present

## 2019-05-09 DIAGNOSIS — F0390 Unspecified dementia without behavioral disturbance: Secondary | ICD-10-CM | POA: Diagnosis not present

## 2019-06-06 DIAGNOSIS — F0390 Unspecified dementia without behavioral disturbance: Secondary | ICD-10-CM | POA: Diagnosis not present

## 2019-06-06 DIAGNOSIS — I10 Essential (primary) hypertension: Secondary | ICD-10-CM | POA: Diagnosis not present

## 2019-06-06 DIAGNOSIS — I4891 Unspecified atrial fibrillation: Secondary | ICD-10-CM | POA: Diagnosis not present

## 2019-06-06 DIAGNOSIS — E785 Hyperlipidemia, unspecified: Secondary | ICD-10-CM | POA: Diagnosis not present

## 2019-06-17 DIAGNOSIS — Z20828 Contact with and (suspected) exposure to other viral communicable diseases: Secondary | ICD-10-CM | POA: Diagnosis not present

## 2019-07-18 DIAGNOSIS — Z20828 Contact with and (suspected) exposure to other viral communicable diseases: Secondary | ICD-10-CM | POA: Diagnosis not present

## 2019-07-25 DIAGNOSIS — Z20828 Contact with and (suspected) exposure to other viral communicable diseases: Secondary | ICD-10-CM | POA: Diagnosis not present

## 2019-07-27 DIAGNOSIS — E559 Vitamin D deficiency, unspecified: Secondary | ICD-10-CM | POA: Diagnosis not present

## 2019-07-27 DIAGNOSIS — I4891 Unspecified atrial fibrillation: Secondary | ICD-10-CM | POA: Diagnosis not present

## 2019-07-27 DIAGNOSIS — F0390 Unspecified dementia without behavioral disturbance: Secondary | ICD-10-CM | POA: Diagnosis not present

## 2019-08-01 DIAGNOSIS — Z20828 Contact with and (suspected) exposure to other viral communicable diseases: Secondary | ICD-10-CM | POA: Diagnosis not present

## 2019-08-09 DIAGNOSIS — Z20828 Contact with and (suspected) exposure to other viral communicable diseases: Secondary | ICD-10-CM | POA: Diagnosis not present

## 2019-08-12 DIAGNOSIS — Z20828 Contact with and (suspected) exposure to other viral communicable diseases: Secondary | ICD-10-CM | POA: Diagnosis not present

## 2019-08-17 DIAGNOSIS — Z20828 Contact with and (suspected) exposure to other viral communicable diseases: Secondary | ICD-10-CM | POA: Diagnosis not present

## 2019-08-21 DIAGNOSIS — Z20828 Contact with and (suspected) exposure to other viral communicable diseases: Secondary | ICD-10-CM | POA: Diagnosis not present

## 2019-08-29 DIAGNOSIS — Z20828 Contact with and (suspected) exposure to other viral communicable diseases: Secondary | ICD-10-CM | POA: Diagnosis not present

## 2019-09-03 DIAGNOSIS — Z20828 Contact with and (suspected) exposure to other viral communicable diseases: Secondary | ICD-10-CM | POA: Diagnosis not present

## 2019-09-14 DIAGNOSIS — I739 Peripheral vascular disease, unspecified: Secondary | ICD-10-CM | POA: Diagnosis not present

## 2019-09-14 DIAGNOSIS — L84 Corns and callosities: Secondary | ICD-10-CM | POA: Diagnosis not present

## 2019-09-26 DIAGNOSIS — I4891 Unspecified atrial fibrillation: Secondary | ICD-10-CM | POA: Diagnosis not present

## 2019-09-26 DIAGNOSIS — D649 Anemia, unspecified: Secondary | ICD-10-CM | POA: Diagnosis not present

## 2019-09-26 DIAGNOSIS — Z7901 Long term (current) use of anticoagulants: Secondary | ICD-10-CM | POA: Diagnosis not present

## 2019-09-26 DIAGNOSIS — I10 Essential (primary) hypertension: Secondary | ICD-10-CM | POA: Diagnosis not present

## 2019-10-03 DIAGNOSIS — E039 Hypothyroidism, unspecified: Secondary | ICD-10-CM | POA: Diagnosis not present

## 2019-10-03 DIAGNOSIS — E559 Vitamin D deficiency, unspecified: Secondary | ICD-10-CM | POA: Diagnosis not present

## 2019-10-03 DIAGNOSIS — E0590 Thyrotoxicosis, unspecified without thyrotoxic crisis or storm: Secondary | ICD-10-CM | POA: Diagnosis not present

## 2019-10-03 DIAGNOSIS — R7989 Other specified abnormal findings of blood chemistry: Secondary | ICD-10-CM | POA: Diagnosis not present

## 2019-10-03 DIAGNOSIS — D649 Anemia, unspecified: Secondary | ICD-10-CM | POA: Diagnosis not present

## 2019-10-03 DIAGNOSIS — E785 Hyperlipidemia, unspecified: Secondary | ICD-10-CM | POA: Diagnosis not present

## 2019-11-07 DIAGNOSIS — Z20828 Contact with and (suspected) exposure to other viral communicable diseases: Secondary | ICD-10-CM | POA: Diagnosis not present

## 2019-11-08 DIAGNOSIS — Z23 Encounter for immunization: Secondary | ICD-10-CM | POA: Diagnosis not present

## 2019-11-11 DIAGNOSIS — Z20828 Contact with and (suspected) exposure to other viral communicable diseases: Secondary | ICD-10-CM | POA: Diagnosis not present

## 2019-11-15 DIAGNOSIS — Z20828 Contact with and (suspected) exposure to other viral communicable diseases: Secondary | ICD-10-CM | POA: Diagnosis not present

## 2019-11-22 DIAGNOSIS — F329 Major depressive disorder, single episode, unspecified: Secondary | ICD-10-CM | POA: Diagnosis not present

## 2019-11-22 DIAGNOSIS — U071 COVID-19: Secondary | ICD-10-CM | POA: Diagnosis not present

## 2019-11-22 DIAGNOSIS — E785 Hyperlipidemia, unspecified: Secondary | ICD-10-CM | POA: Diagnosis not present

## 2019-12-06 DIAGNOSIS — Z23 Encounter for immunization: Secondary | ICD-10-CM | POA: Diagnosis not present

## 2019-12-29 DIAGNOSIS — B351 Tinea unguium: Secondary | ICD-10-CM | POA: Diagnosis not present

## 2019-12-29 DIAGNOSIS — M79675 Pain in left toe(s): Secondary | ICD-10-CM | POA: Diagnosis not present

## 2019-12-29 DIAGNOSIS — M79674 Pain in right toe(s): Secondary | ICD-10-CM | POA: Diagnosis not present

## 2020-01-06 DIAGNOSIS — D649 Anemia, unspecified: Secondary | ICD-10-CM | POA: Diagnosis not present

## 2020-01-06 DIAGNOSIS — E876 Hypokalemia: Secondary | ICD-10-CM | POA: Diagnosis not present

## 2020-01-20 DIAGNOSIS — E785 Hyperlipidemia, unspecified: Secondary | ICD-10-CM | POA: Diagnosis not present

## 2020-01-20 DIAGNOSIS — I4891 Unspecified atrial fibrillation: Secondary | ICD-10-CM | POA: Diagnosis not present

## 2020-01-20 DIAGNOSIS — Z7901 Long term (current) use of anticoagulants: Secondary | ICD-10-CM | POA: Diagnosis not present

## 2020-01-20 DIAGNOSIS — F329 Major depressive disorder, single episode, unspecified: Secondary | ICD-10-CM | POA: Diagnosis not present

## 2020-03-07 ENCOUNTER — Telehealth: Payer: Self-pay

## 2020-03-07 DIAGNOSIS — L84 Corns and callosities: Secondary | ICD-10-CM | POA: Diagnosis not present

## 2020-03-07 DIAGNOSIS — I739 Peripheral vascular disease, unspecified: Secondary | ICD-10-CM | POA: Diagnosis not present

## 2020-03-07 NOTE — Telephone Encounter (Signed)
  Patient Consent for Virtual Visit         Sarah Parsons has provided verbal consent on 03/07/2020 for a virtual visit (video or telephone).   CONSENT FOR VIRTUAL VISIT FOR:  Sarah Parsons  By participating in this virtual visit I agree to the following:  I hereby voluntarily request, consent and authorize [facility] and its employed or contracted physicians, physician assistants, nurse practitioners or other licensed health care professionals (the Practitioner), to provide me with telemedicine health care services (the "Services") as deemed necessary by the treating Practitioner. I acknowledge and consent to receive the Services by the Practitioner via telemedicine. I understand that the telemedicine visit will involve communicating with the Practitioner through live audiovisual communication technology and the disclosure of certain medical information by electronic transmission. I acknowledge that I have been given the opportunity to request an in-person assessment or other available alternative prior to the telemedicine visit and am voluntarily participating in the telemedicine visit.  I understand that I have the right to withhold or withdraw my consent to the use of telemedicine in the course of my care at any time, without affecting my right to future care or treatment, and that the Practitioner or I may terminate the telemedicine visit at any time. I understand that I have the right to inspect all information obtained and/or recorded in the course of the telemedicine visit and may receive copies of available information for a reasonable fee.  I understand that some of the potential risks of receiving the Services via telemedicine include:  Marland Kitchen Delay or interruption in medical evaluation due to technological equipment failure or disruption; . Information transmitted may not be sufficient (e.g. poor resolution of images) to allow for appropriate medical decision making by the  Practitioner; and/or  . In rare instances, security protocols could fail, causing a breach of personal health information.  Furthermore, I acknowledge that it is my responsibility to provide information about my medical history, conditions and care that is complete and accurate to the best of my ability. I acknowledge that Practitioner's advice, recommendations, and/or decision may be based on factors not within their control, such as incomplete or inaccurate data provided by me or distortions of diagnostic images or specimens that may result from electronic transmissions. I understand that the practice of medicine is not an exact science and that Practitioner makes no warranties or guarantees regarding treatment outcomes. I acknowledge that a copy of this consent can be made available to me via my patient portal ([facility]), or I can request a printed copy by calling the office of [facility].    I understand that my insurance will be billed for this visit.   I have read or had this consent read to me. . I understand the contents of this consent, which adequately explains the benefits and risks of the Services being provided via telemedicine.  . I have been provided ample opportunity to ask questions regarding this consent and the Services and have had my questions answered to my satisfaction. . I give my informed consent for the services to be provided through the use of telemedicine in my medical care

## 2020-03-09 ENCOUNTER — Encounter: Payer: Self-pay | Admitting: Cardiovascular Disease

## 2020-03-09 ENCOUNTER — Telehealth (INDEPENDENT_AMBULATORY_CARE_PROVIDER_SITE_OTHER): Payer: Medicare (Managed Care) | Admitting: Cardiovascular Disease

## 2020-03-09 DIAGNOSIS — E7800 Pure hypercholesterolemia, unspecified: Secondary | ICD-10-CM

## 2020-03-09 DIAGNOSIS — I10 Essential (primary) hypertension: Secondary | ICD-10-CM | POA: Diagnosis not present

## 2020-03-09 DIAGNOSIS — I480 Paroxysmal atrial fibrillation: Secondary | ICD-10-CM | POA: Diagnosis not present

## 2020-03-09 DIAGNOSIS — Z7901 Long term (current) use of anticoagulants: Secondary | ICD-10-CM

## 2020-03-09 NOTE — Progress Notes (Signed)
Virtual Visit via Telephone Note   This visit type was conducted due to national recommendations for restrictions regarding the COVID-19 Pandemic (e.g. social distancing) in an effort to limit this patient's exposure and mitigate transmission in our community.  Due to her co-morbid illnesses, this patient is at least at moderate risk for complications without adequate follow up.  This format is felt to be most appropriate for this patient at this time.  The patient did not have access to video technology/had technical difficulties with video requiring transitioning to audio format only (telephone).  All issues noted in this document were discussed and addressed.  No physical exam could be performed with this format.  Please refer to the patient's chart for her  consent to telehealth for [facility].   The patient was identified using 2 identifiers.  Date:  03/09/2020   ID:  Sarah Parsons, DOB May 26, 1946, MRN [id number]  Patient location: Assisted living facility Provider Location: Home  PCP:  Jani Gravel, MD  Cardiologist:  Kate Sable, MD  Electrophysiologist:  None   Evaluation Performed:  Follow-Up Visit  Chief Complaint:  PAF  History of Present Illness:    Sarah Parsons is a 74 y.o. female with paroxysmal atrial fibrillation.  The patient denies any symptoms of chest pain, palpitations, shortness of breath, lightheadedness, dizziness, leg swelling, orthopnea, PND, bleeding problems, and syncope.  She resides at [facility] in [place].  I spoke with her nurse, Ollen Barges.  Her appetite is good.   Past Medical History:  Diagnosis Date  . 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 Alcoholic hepatitis 8/36/6294  . Alcoholism /alcohol abuse ([place])   . Anemia   . Cholelithiasis 11/17/2012  . Fatty liver 11/17/2012  . FTT (failure to thrive) in adult 11/16/2012  . Hypercholesteremia   . Hypertension   .  Tobacco abuse   . UTI (urinary tract infection) 11/17/2012   Past Surgical History:  Procedure Laterality Date  . COLONOSCOPY WITH PROPOFOL N/A 02/19/2016   Procedure: COLONOSCOPY WITH PROPOFOL;  Surgeon: Danie Binder, MD;  [facility];  Service: Endoscopy;  Laterality: N/A;  0930 - moved to 4/18 @ 9:00 - office notified pt     Current Meds  Medication Sig  . acetaminophen (TYLENOL) 500 MG tablet Take 500 mg by mouth 2 (two) times daily.  Marland Kitchen apixaban (ELIQUIS) 5 MG TABS tablet Take 1 tablet (5 mg total) by mouth 2 (two) times daily.  Marland Kitchen atorvastatin (LIPITOR) 10 MG tablet Take 10 mg by mouth daily at 6 PM.   . metoprolol succinate (TOPROL XL) 25 MG 24 hr tablet Take 1 tablet (25 mg total) by mouth 2 (two) times daily.  . Vitamin D, Ergocalciferol, (DRISDOL) 50000 units CAPS capsule Take 50,000 Units by mouth every 7 (seven) days.  . [DISCONTINUED] Amino Acids-Protein Hydrolys (FEEDING SUPPLEMENT, PRO-STAT SUGAR FREE 64,) LIQD Take 30 mLs by mouth 2 (two) times daily.     Allergies:   Ativan [lorazepam], Ciprofloxacin, and Penicillins   Social History   Tobacco Use  . Smoking status: Former Smoker    Packs/day: 1.00    Years: 40.00    Pack years: 40.00    Types: Cigarettes    Start date: 06/26/1960    Quit date: 06/28/2013    Years since quitting: 6.7  . Smokeless tobacco: Never Used  Substance Use Topics  . Alcohol use: No    Alcohol/week: 0.0 standard drinks    Comment:  Not currently; Last ETOH 10/2014, previously 1/2 pint daily for 8-10 years.  . Drug use: No     Family Hx: The patient's family history is negative for Colon cancer.  ROS:   Please see the history of present illness.     All other systems reviewed and are negative.   Prior CV studies:   The following studies were reviewed today:  Echocardiogram 12/18/15: Normal left ventricular systolicand diastolicfunction and regional wall motion, LVEF 65-70%, mild LVH.  Labs/Other Tests and Data  Reviewed:    EKG:  No ECG reviewed.  Recent Labs: No results found for requested labs within last 8760 hours.   Recent Lipid Panel Lab Results  Component Value Date/Time   CHOL 159 07/20/2018 12:00 AM   TRIG 62 07/20/2018 12:00 AM   HDL 70 07/20/2018 12:00 AM   LDLCALC 76 07/20/2018 12:00 AM    Wt Readings from Last 3 Encounters:  01/04/19 160 lb (72.6 kg)  12/29/16 172 lb (78 kg)  07/01/16 171 lb (77.6 kg)     Objective:    Vital Signs:  There were no vitals taken for this visit.   VITAL SIGNS:  reviewed  ASSESSMENT & PLAN:    1.Paroxysmalatrial fibrillation: Symptomatically stableon Toprol XL 25 mg bid. CHA2DS2VASc score is 3 thus elevated thromboembolic risk. Will continue Eliquis 5 mg bid.  2. Essential HTN: No changes.  3. Hyperlipidemia: On Lipitor 10 mg.    COVID-19 Education: The signs and symptoms of COVID-19 were discussed with the patient and how to seek care for testing (follow up with PCP or arrange E-visit).  The importance of social distancing was discussed today.  Time:   Today, I have spent 15 minutes with the patient with telehealth technology discussing the above problems.     Medication Adjustments/Labs and Tests Ordered: Current medicines are reviewed at length with the patient today.  Concerns regarding medicines are outlined above.   Tests Ordered: No orders of the defined types were placed in this encounter.   Medication Changes: No orders of the defined types were placed in this encounter.   Follow Up:  In Person in 1 year(s) with APP  Signed, Kate Sable, MD  03/09/2020 11:03 AM    [place]

## 2020-03-09 NOTE — Patient Instructions (Signed)
Medication Instructions: Your physician recommends that you continue on your current medications as directed. Please refer to the Current Medication list given to you today.   Labwork: None today  Procedures/Testing: None today  Follow-Up: 1 year Office visit with Dr.Koneswarsn  Any Additional Special Instructions Will Be Listed Below (If Applicable).     If you need a refill on your cardiac medications before your next appointment, please call your pharmacy.

## 2020-03-13 DIAGNOSIS — I10 Essential (primary) hypertension: Secondary | ICD-10-CM | POA: Diagnosis not present

## 2020-03-13 DIAGNOSIS — E559 Vitamin D deficiency, unspecified: Secondary | ICD-10-CM | POA: Diagnosis not present

## 2020-03-13 DIAGNOSIS — I4891 Unspecified atrial fibrillation: Secondary | ICD-10-CM | POA: Diagnosis not present

## 2020-03-30 DIAGNOSIS — D519 Vitamin B12 deficiency anemia, unspecified: Secondary | ICD-10-CM | POA: Diagnosis not present

## 2020-03-30 DIAGNOSIS — E559 Vitamin D deficiency, unspecified: Secondary | ICD-10-CM | POA: Diagnosis not present

## 2020-03-30 DIAGNOSIS — D509 Iron deficiency anemia, unspecified: Secondary | ICD-10-CM | POA: Diagnosis not present

## 2020-03-30 DIAGNOSIS — D649 Anemia, unspecified: Secondary | ICD-10-CM | POA: Diagnosis not present

## 2020-03-30 DIAGNOSIS — E785 Hyperlipidemia, unspecified: Secondary | ICD-10-CM | POA: Diagnosis not present

## 2020-03-30 DIAGNOSIS — N184 Chronic kidney disease, stage 4 (severe): Secondary | ICD-10-CM | POA: Diagnosis not present

## 2020-04-03 DIAGNOSIS — E785 Hyperlipidemia, unspecified: Secondary | ICD-10-CM | POA: Diagnosis not present

## 2020-04-03 DIAGNOSIS — I10 Essential (primary) hypertension: Secondary | ICD-10-CM | POA: Diagnosis not present

## 2020-04-03 DIAGNOSIS — E559 Vitamin D deficiency, unspecified: Secondary | ICD-10-CM | POA: Diagnosis not present

## 2021-03-11 NOTE — Progress Notes (Signed)
CARDIOLOGY CONSULT NOTE       Patient ID: Sarah Parsons MRN: [id number] DOB/AGE: 11/20/1945 75 y.o.  Admit date: (Not on file) Referring Physician: KIm Primary Physician: Jani Gravel, MD Primary Cardiologist: New previously seen by Bronson Ing Reason for Consultation: PAF  Active Problems:   * No active hospital problems. *   HPI:  75 y.o. previously seen by Dr Raliegh Ip  Referred by dr Maudie Mercury for PAF. She is in assisted living [facility] She appears to be in chronic afib and maintained on Toprol and eliquis with CHADVASC 3 CRF;s include HTN and HLD  She is an alcoholic with dementia   She doesn't do much at assisted living CNA indicates no falls but only pivots to change position And doesn't walk much No bleeding issues  She was actually in NSR today   ROS All other systems reviewed and negative except as noted above  Past Medical History:  Diagnosis Date  . 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 Alcoholism /alcohol abuse   . Anemia   . Cholelithiasis 11/17/2012  . Fatty liver 11/17/2012  . FTT (failure to thrive) in adult 11/16/2012  . Hypercholesteremia   . Hypertension   . Tobacco abuse   . UTI (urinary tract infection) 11/17/2012    Family History  Problem Relation Age of Onset  . Colon cancer Neg Hx     Social History   Socioeconomic History  . Marital status: Married    Spouse name: Clinical research associate Sr.   . Number of children: 2  . Years of education: 90   . Highest education level: Not on file  Occupational History  . Not on file  Tobacco Use  . Smoking status: Former Smoker    Packs/day: 1.00    Years: 40.00    Pack years: 40.00    Types: Cigarettes    Start date: 06/26/1960    Quit date: 06/28/2013    Years since quitting: 7.7  . Smokeless tobacco: Never Used  Vaping Use  . Vaping Use: Never used  Substance and Sexual Activity  . Alcohol use: No    Alcohol/week: 0.0  standard drinks    Comment: Not currently; Last ETOH 10/2014, previously 1/2 pint daily for 8-10 years.  . Drug use: No  . Sexual activity: Never  Other Topics Concern  . Not on file  Social History Narrative   Born: Therapist, music   Occupation: ?   Children:  2 ,  [name] (daughter), and Hortencia Conradi.  (son)   Hobbies:  none   Social Determinants of Health   Financial Resource Strain: Not on file  Food Insecurity: Not on file  Transportation Needs: Not on file  Physical Activity: Not on file  Stress: Not on file  Social Connections: Not on file  Intimate Partner Violence: Not on file    Past Surgical History:  Procedure Laterality Date  . COLONOSCOPY WITH PROPOFOL N/A 02/19/2016   Procedure: COLONOSCOPY WITH PROPOFOL;  Surgeon: Danie Binder, MD;  [facility];  Service: Endoscopy;  Laterality: N/A;  0930 - moved to 4/18 @ 9:00 - office notified pt      Current Outpatient Medications:  .  acetaminophen (TYLENOL) 500 MG tablet, Take 500 mg by mouth 2 (two) times daily., Disp: , Rfl:  .  apixaban (ELIQUIS) 5 MG TABS tablet, Take 1 tablet (5 mg total) by mouth 2 (two) times daily., Disp: , Rfl:  .  atorvastatin (LIPITOR) 10 MG tablet, Take 10 mg by mouth daily at 6 PM. , Disp: , Rfl:  .  metoprolol succinate (TOPROL XL) 25 MG 24 hr tablet, Take 1 tablet (25 mg total) by mouth 2 (two) times daily., Disp: , Rfl:  .  Vitamin D, Ergocalciferol, (DRISDOL) 50000 units CAPS capsule, Take 50,000 Units by mouth every 7 (seven) days., Disp: , Rfl:     Physical Exam:  Pulse 80   Ht '5\' 9"'$  (1.753 m)   SpO2 98%   BMI 23.63 kg/m   Affect appropriate Chronically ill black female  HEENT: normal Neck supple with no adenopathy JVP normal no bruits no thyromegaly Lungs clear with no wheezing and good diaphragmatic motion Heart:  S1/S2 no murmur, no rub, gallop or click PMI normal Abdomen: benighn, BS positve, no tenderness, no AAA no bruit.  No HSM or HJR Distal  pulses intact with no bruits No edema Neuro non-focal Skin warm and dry No muscular weakness   Labs:   Lab Results  Component Value Date   WBC 7.4 02/13/2016   HGB 12.9 02/13/2016   HCT 40.0 02/13/2016   MCV 92.8 02/13/2016   PLT 237 02/13/2016   No results for input(s): NA, K, CL, CO2, BUN, CREATININE, CALCIUM, PROT, BILITOT, ALKPHOS, ALT, AST, GLUCOSE in the last 168 hours.  Invalid input(s): LABALBU Lab Results  Component Value Date   TROPONINI <0.30 11/16/2012    Lab Results  Component Value Date   CHOL 159 07/20/2018   Lab Results  Component Value Date   HDL 70 07/20/2018   Lab Results  Component Value Date   LDLCALC 76 07/20/2018   Lab Results  Component Value Date   TRIG 62 07/20/2018   No results found for: CHOLHDL No results found for: LDLDIRECT    Radiology: No results found.  EKG: 2019 SR rate 74 otherwise normal    ASSESSMENT AND PLAN:   1. PaF: continue eliquis and beta blocker in NSR today labs with Dr Judi Cong at assisted living  2. HTN:  Well controlled.  Continue current medications and low sodium Dash type diet.   3. HLD:  Continue statin labs with primary  4. Dementia:  Stable resides in assisted living ? Secondary to ETOH  F/U in a year   Signed: Jenkins Rouge 03/19/2021, 1:24 PM

## 2021-03-19 ENCOUNTER — Other Ambulatory Visit: Payer: Self-pay

## 2021-03-19 ENCOUNTER — Encounter: Payer: Self-pay | Admitting: Cardiovascular Disease

## 2021-03-19 ENCOUNTER — Ambulatory Visit (INDEPENDENT_AMBULATORY_CARE_PROVIDER_SITE_OTHER): Payer: Medicare (Managed Care) | Admitting: Cardiovascular Disease

## 2021-03-19 VITALS — BP 112/58 | HR 80 | Ht 69.0 in

## 2021-03-19 DIAGNOSIS — I480 Paroxysmal atrial fibrillation: Secondary | ICD-10-CM

## 2021-03-19 DIAGNOSIS — Z7901 Long term (current) use of anticoagulants: Secondary | ICD-10-CM | POA: Diagnosis not present

## 2021-03-19 DIAGNOSIS — I10 Essential (primary) hypertension: Secondary | ICD-10-CM | POA: Diagnosis not present

## 2021-03-19 NOTE — Patient Instructions (Signed)

## 2021-05-09 ENCOUNTER — Other Ambulatory Visit: Payer: Self-pay

## 2021-05-09 ENCOUNTER — Emergency Department (HOSPITAL_COMMUNITY)
Admission: EM | Admit: 2021-05-09 | Discharge: 2021-05-09 | Disposition: A | Discharge status: Home / Self Care | Payer: Medicare (Managed Care) | Attending: Emergency Medicine | Admitting: Emergency Medicine

## 2021-05-09 ENCOUNTER — Emergency Department (HOSPITAL_COMMUNITY): Payer: Medicare (Managed Care)

## 2021-05-09 ENCOUNTER — Encounter (HOSPITAL_COMMUNITY): Payer: Self-pay | Admitting: Emergency Medicine

## 2021-05-09 DIAGNOSIS — Z7901 Long term (current) use of anticoagulants: Secondary | ICD-10-CM | POA: Insufficient documentation

## 2021-05-09 DIAGNOSIS — Z87891 Personal history of nicotine dependence: Secondary | ICD-10-CM | POA: Insufficient documentation

## 2021-05-09 DIAGNOSIS — I10 Essential (primary) hypertension: Secondary | ICD-10-CM | POA: Diagnosis not present

## 2021-05-09 DIAGNOSIS — Z79899 Other long term (current) drug therapy: Secondary | ICD-10-CM | POA: Insufficient documentation

## 2021-05-09 DIAGNOSIS — R4182 Altered mental status, unspecified: Secondary | ICD-10-CM | POA: Diagnosis present

## 2021-05-09 DIAGNOSIS — F0390 Unspecified dementia without behavioral disturbance: Secondary | ICD-10-CM | POA: Diagnosis not present

## 2021-05-09 LAB — CBC WITH DIFFERENTIAL/PLATELET
Abs Immature Granulocytes: 0.02 10*3/uL (ref 0.00–0.07)
Basophils Absolute: 0 10*3/uL (ref 0.0–0.1)
Basophils Relative: 0 %
Eosinophils Absolute: 0.2 10*3/uL (ref 0.0–0.5)
Eosinophils Relative: 3 %
HCT: 35.9 % — ABNORMAL LOW (ref 36.0–46.0)
Hemoglobin: 11.5 g/dL — ABNORMAL LOW (ref 12.0–15.0)
Immature Granulocytes: 0 %
Lymphocytes Relative: 26 %
Lymphs Abs: 1.4 10*3/uL (ref 0.7–4.0)
MCH: 30.6 pg (ref 26.0–34.0)
MCHC: 32 g/dL (ref 30.0–36.0)
MCV: 95.5 fL (ref 80.0–100.0)
Monocytes Absolute: 0.4 10*3/uL (ref 0.1–1.0)
Monocytes Relative: 8 %
Neutro Abs: 3.3 10*3/uL (ref 1.7–7.7)
Neutrophils Relative %: 63 %
Platelets: 180 10*3/uL (ref 150–400)
RBC: 3.76 MIL/uL — ABNORMAL LOW (ref 3.87–5.11)
RDW: 12.2 % (ref 11.5–15.5)
WBC: 5.3 10*3/uL (ref 4.0–10.5)
nRBC: 0 % (ref 0.0–0.2)

## 2021-05-09 LAB — URINALYSIS, ROUTINE W REFLEX MICROSCOPIC
Bilirubin Urine: NEGATIVE
Glucose, UA: NEGATIVE mg/dL
Ketones, ur: NEGATIVE mg/dL
Nitrite: NEGATIVE
Protein, ur: NEGATIVE mg/dL
Specific Gravity, Urine: 1.011 (ref 1.005–1.030)
pH: 5 (ref 5.0–8.0)

## 2021-05-09 LAB — COMPREHENSIVE METABOLIC PANEL
ALT: 10 U/L (ref 0–44)
AST: 16 U/L (ref 15–41)
Albumin: 3.7 g/dL (ref 3.5–5.0)
Alkaline Phosphatase: 74 U/L (ref 38–126)
Anion gap: 6 (ref 5–15)
BUN: 14 mg/dL (ref 8–23)
CO2: 26 mmol/L (ref 22–32)
Calcium: 9.1 mg/dL (ref 8.9–10.3)
Chloride: 107 mmol/L (ref 98–111)
Creatinine, Ser: 0.8 mg/dL (ref 0.44–1.00)
GFR, Estimated: >60 mL/min (ref >60)
Glucose, Bld: 96 mg/dL (ref 70–99)
Potassium: 3.9 mmol/L (ref 3.5–5.1)
Sodium: 139 mmol/L (ref 135–145)
Total Bilirubin: 0.9 mg/dL (ref 0.3–1.2)
Total Protein: 6.7 g/dL (ref 6.5–8.1)

## 2021-05-09 LAB — CBG MONITORING, ED: Glucose-Capillary: 93 mg/dL (ref 70–99)

## 2021-05-09 MED ORDER — SULFAMETHOXAZOLE-TRIMETHOPRIM 800-160 MG PO TABS: 1.0000 | ORAL_TABLET | Freq: Two times a day (BID) | ORAL | 0 refills | Status: AC

## 2021-05-09 NOTE — ED Triage Notes (Signed)
Pt to the ED from [facility]. She was found by staff to be altered this morning as she was lying in bed not speaking, and she is usually rolling around in her wheel chair.  Pt had a LKW of sometime yesterday, no notes were made to the contrary by night staff.  Pt is alert to self and knows she is at the hospital. Pt follows commands

## 2021-05-09 NOTE — ED Provider Notes (Signed)
No [facility] EMERGENCY DEPARTMENT Provider Note   CSN: [id number] Arrival date & time: 05/09/21  0840     History Chief Complaint  Patient presents with   Altered Mental Status    Sarah Parsons is a 75 y.o. female.  HPI  Patient with significant medical history of A. fib currently on Eliquis, alcohol dementia, hypertension presents to the emergency department with chief complaint of altered mental status.  Patient is coming from [place], nursing staff explained that patient does not appear to be herself, states over the last 24 hours patient has not been as talkative as she normally is, does not want to get out of bed, has not been eating and drinking like she normally does.  They deny recent falls, fevers, urinary symptoms.  They endorse at baseline patient can talk and hold a conversation, does not  ambulate she is mostly in a wheelchair.    Speaking with the patient she is able to answer all my questions, she does not endorse any pain at this time, she is able to follow commands, she denies headaches, fevers, chills, chest pain, shortness of breath, abdominal pain, denies any pain.  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   Alcoholic hepatitis Q000111Q   Alcoholism /alcohol abuse    Anemia    Cholelithiasis 11/17/2012   Fatty liver 11/17/2012   FTT (failure to thrive) in adult 11/16/2012   Hypercholesteremia    Hypertension    Tobacco abuse    UTI (urinary tract infection) 11/17/2012    Patient Active Problem List   Diagnosis Date Noted   Hyperthyroidism 06/09/2017   Multinodular goiter 06/09/2017   PAF (paroxysmal atrial fibrillation) ([place]) 07/01/2016   Chronic anticoagulation 07/01/2016   Encounter for screening colonoscopy 10/17/2015   Altered mental status 07/07/2013   Anemia 07/07/2013   UTI (urinary tract infection) 11/17/2012   Cholelithiasis 11/17/2012   Fatty  liver [id number]   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[place]) [id number]   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     Family History  Problem Relation Age of Onset   Colon cancer Neg Hx     Social History   Tobacco Use   Smoking status: Former    Packs/day: 1.00    Years: 40.00    Pack years: 40.00    Types: Cigarettes    Start date: 06/26/1960    Quit date: 06/28/2013    Years since quitting: 7.8   Smokeless tobacco: Never  Vaping Use   Vaping Use: Never used  Substance Use Topics   Alcohol use: No    Alcohol/week: 0.0 standard drinks    Comment: Not currently; Last ETOH 10/2014, previously 1/2 pint daily for 8-10 years.   Drug use: No    Home Medications Prior to Admission medications   Medication Sig Start Date End Date Taking? Authorizing Provider  acetaminophen (  TYLENOL) 500 MG tablet Take 500 mg by mouth 2 (two) times daily.   Yes [provider]  apixaban (ELIQUIS) 5 MG TABS tablet Take 1 tablet (5 mg total) by mouth 2 (two) times daily. 11/29/15  Yes Herminio Commons, MD  atorvastatin (LIPITOR) 10 MG tablet Take 10 mg by mouth daily at 6 PM.  10/01/15  Yes [provider]  Cholecalciferol 125 MCG (5000 UT) TABS Take 5,000 Units by mouth daily.   Yes [provider]  metoprolol succinate (TOPROL XL) 25 MG 24 hr tablet Take 1 tablet (25 mg total) by mouth 2 (two) times daily. 11/29/15  Yes Herminio Commons, MD  sulfamethoxazole-trimethoprim (BACTRIM DS) 800-160 MG tablet Take 1 tablet by mouth 2 (two) times daily for 3 days. 05/09/21 05/12/21 Yes Marcello Fennel, PA-C    Allergies    Ativan [lorazepam], Ciprofloxacin, and Penicillins  Review of Systems   Review of Systems  Constitutional:  Negative for chills and fever.  HENT:  Negative for congestion.   Respiratory:  Negative for shortness of breath.   Cardiovascular:  Negative for chest pain.  Gastrointestinal:  Negative for abdominal pain.  Genitourinary:  Negative for enuresis.  Musculoskeletal:  Negative for back pain.  Skin:  Negative for rash.  Neurological:  Negative for headaches.  Hematological:  Does not bruise/bleed easily.   Physical Exam Updated Vital Signs BP (!) 158/70   Pulse (!) 56   Temp 97.7 F (36.5 C) (Oral)   Resp 11   Ht '5\' 9"'$  (1.753 m)   Wt 72.6 kg   SpO2 100%   BMI 23.64 kg/m   Physical Exam Vitals and nursing note reviewed.  Constitutional:      General: She is not in acute distress.    Appearance: She is not ill-appearing.  HENT:     Head: Normocephalic and atraumatic.     Nose: No congestion.  Eyes:     Extraocular Movements: Extraocular movements intact.     Conjunctiva/sclera: Conjunctivae normal.     Pupils: Pupils are equal, round, and reactive to light.  Cardiovascular:     Rate and Rhythm: Normal rate and regular rhythm.     Pulses: Normal pulses.     Heart sounds: No murmur heard.   No friction rub. No gallop.  Pulmonary:     Effort: No respiratory distress.     Breath sounds: No wheezing, rhonchi or rales.  Abdominal:     Palpations: Abdomen is soft.     Tenderness: There is no abdominal tenderness.  Musculoskeletal:     Right lower leg: No edema.     Left lower leg: No edema.  Skin:    General: Skin is warm and dry.  Neurological:     Mental Status: She is alert.     GCS: GCS eye subscore is 4. GCS verbal subscore is 5. GCS motor subscore is 6.     Cranial Nerves:  No facial asymmetry.     Motor: No weakness.     Coordination: Romberg sign negative. Finger-Nose-Finger Test normal.     Comments: Cranial nerves II through XII are grossly intact  Patient is able to follow commands without difficulty  No facial asymmetry  Psychiatric:        Mood and Affect: Mood normal.    ED Results / Procedures / Treatments   Labs (all labs ordered are listed, but only abnormal results are displayed) Labs Reviewed  CBC WITH DIFFERENTIAL/PLATELET - Abnormal; Notable  for the following components:      Result Value   RBC 3.76 (*)    Hemoglobin 11.5 (*)    HCT 35.9 (*)    All other components within normal limits  URINALYSIS, ROUTINE W REFLEX MICROSCOPIC - Abnormal; Notable for the following components:   APPearance HAZY (*)    Hgb urine dipstick SMALL (*)    Leukocytes,Ua MODERATE (*)    Bacteria, UA FEW (*)    All other components within normal limits  URINE CULTURE  COMPREHENSIVE METABOLIC PANEL  CBG MONITORING, ED    EKG EKG Interpretation  Date/Time:  Thursday May 09 2021 08:51:42 EDT Ventricular Rate:  68 PR Interval:  184 QRS Duration: 84 QT Interval:  424 QTC Calculation: 451 R Axis:   6 Text Interpretation: Sinus rhythm Atrial premature complex Probable anteroseptal infarct, old No significant change since last tracing Confirmed by Calvert Cantor [id number]) on 05/09/2021 10:43:31 AM  Radiology DG Chest Port 1 View  Result Date: 05/09/2021 CLINICAL DATA:  Altered mental status EXAM: PORTABLE CHEST 1 VIEW COMPARISON:  07/05/2013 FINDINGS: Normal heart size and mediastinal contours. No acute infiltrate or edema. No effusion or pneumothorax. No acute osseous findings. IMPRESSION: No evidence of active disease. Electronically Signed   By: Monte Fantasia M.D.   On: 05/09/2021 10:04    Procedures Procedures   Medications Ordered in ED Medications - No data to display  ED Course  I have reviewed the triage vital signs and the nursing  notes.  Pertinent labs & imaging results that were available during my care of the patient were reviewed by me and considered in my medical decision making (see chart for details).    MDM Rules/Calculators/A&P                         Initial impression-Patient presents with concerns of altered mental status.  She is alert, does not appear acute stress, vital signs reassuring.  Will obtain basic lab work-up chest x-ray and reassess.  Work-up-CBC shows worsening anemia with hemoglobin 11.5, CMP unremarkable, UA shows large moderate leukocytes, 20-50 white blood cells, few bacteria, chest x-ray unremarkable, EKG sinus without signs of ischemia no ST elevation depression noted.  Reassessment  note that patient's UA is slightly abnormal, possible this could be the cause of her slight decrease in mental status, will culture urine and start her on antibiotics.  Rule out-low suspicion for systemic infection as patient is nontoxic-appearing, vital signs reassuring, no leukocytosis seen on CBC.  Low suspicion for CVA and/or intracranial head bleed as there is no focal deficits present my exam, patient is able to follow commands all difficulty, there is no recent falls or head trauma.  Low suspicion for URI or pneumonia as lung sounds are clear bilaterally, chest x-ray unremarkable.  Low suspicion for intra-abdominal abnormality as abdomen soft nontender to palpation.  Plan-  Altered mental status-possibly could be from beginning of a UTI, will start her on antibiotics, have her follow-up with PCP for further evaluation.  Vital signs have remained stable, no indication for hospital admission.  Patient discussed with attending and they agreed with assessment and plan.  Patient given at home care as well strict return precautions.  Patient verbalized that they understood agreed to said plan.  Final Clinical Impression(s) / ED Diagnoses Final diagnoses:  Altered mental status, unspecified altered mental  status type    Rx / DC Orders ED Discharge Orders  Ordered    sulfamethoxazole-trimethoprim (BACTRIM DS) 800-160 MG tablet  2 times daily        05/09/21 1150             Aron Baba 05/09/21 1151    Truddie Hidden, MD 05/09/21 1245

## 2021-05-09 NOTE — Discharge Instructions (Addendum)
UA shows possible UTI, started you on antibiotics, please take as prescribed.  Please continue all other medications.  Please follow up with PCP in 1 week's time for recheck of your urine.  Come back to the emergency department if you develop chest pain, shortness of breath, severe abdominal pain, uncontrolled nausea, vomiting, diarrhea.

## 2021-05-09 NOTE — ED Notes (Signed)
Report given to [place]

## 2021-05-11 LAB — URINE CULTURE

## 2022-10-23 NOTE — Progress Notes (Signed)
CARDIOLOGY CONSULT NOTE       Patient ID: Sarah Parsons MRN: [id number] DOB/AGE: Mar 10, 1946 76 y.o.  Admit date: (Not on file) Referring Physician: KIm Primary Physician: Jani Gravel, MD Primary Cardiologist: Johnsie Cancel  HPI:  76 y.o. previously seen by Dr Raliegh Ip  Referred by dr Maudie Mercury for PAF. She is in assisted living [facility] She appears to be in chronic afib and maintained on Toprol and eliquis with CHADVASC 3 CRF;s include HTN and HLD  She is an alcoholic with dementia   She doesn't do much at assisted living CNA indicates no falls but only pivots to change position And doesn't walk much No bleeding issues  First seen in office 03/19/21 and was in NSR at that time In NSR today as well   Still with limited mobility No teeth.dentures only eating soft food No bleeding issues on DOAC  ROS All other systems reviewed and negative except as noted above  Past Medical History:  Diagnosis Date   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    Number of children: 2   Years of education: 12    Highest education level: Not on file  Occupational History   Not on file  Tobacco Use   Smoking status: Former    Packs/day: 1.00    Years: 40.00    Total pack years: 40.00    Types: Cigarettes    Start date: 06/26/1960    Quit date: 06/28/2013    Years since quitting: 9.3   Smokeless tobacco: Never  Vaping Use   Vaping Use: Never used  Substance and Sexual Activity   Alcohol use: No    Alcohol/week: 0.0  standard drinks of alcohol    Comment: Not currently; Last ETOH 10/2014, previously 1/2 pint daily for 8-10 years.   Drug use: No   Sexual activity: Never  Other Topics Concern   Not on file  Social History Narrative   Born: [place]   Occupation: ?   Children:  2 ,  [name] (daughter), and Hortencia Conradi.  (son)   Hobbies:  none   Social Determinants of Health   Financial Resource Strain: Not on file  Food Insecurity: Not on file  Transportation Needs: Not on file  Physical Activity: Not on file  Stress: Not on file  Social Connections: Not on file  Intimate Partner Violence: Not on file    Past Surgical History:  Procedure Laterality Date   COLONOSCOPY WITH PROPOFOL N/A 02/19/2016   Procedure: COLONOSCOPY WITH PROPOFOL;  Surgeon: Danie Binder, MD;  Location: [facility];  Service: Endoscopy;  Laterality: N/A;  0930 - moved to 4/18 @ 9:00 - office notified pt      Current Outpatient Medications:    acetaminophen (TYLENOL) 500 MG tablet, Take 500 mg by mouth 2 (two) times daily., Disp: , Rfl:    apixaban (ELIQUIS) 5 MG TABS tablet, Take 1 tablet (5 mg total) by  mouth 2 (two) times daily., Disp: , Rfl:    atorvastatin (LIPITOR) 10 MG tablet, Take 10 mg by mouth daily at 6 PM. , Disp: , Rfl:    Cholecalciferol 125 MCG (5000 UT) TABS, Take 5,000 Units by mouth daily., Disp: , Rfl:    metoprolol succinate (TOPROL XL) 25 MG 24 hr tablet, Take 1 tablet (25 mg total) by mouth 2 (two) times daily., Disp: , Rfl:    mirtazapine (REMERON) 7.5 MG tablet, Take 7.5 mg by mouth at bedtime., Disp: , Rfl:     Physical Exam:  BP (!) 146/81   Pulse 86   Ht 5\' 6"  (1.676 m)   Wt 160 lb (72.6 kg) Comment: estimated weight-unable to weigh pt  BMI 25.82 kg/m   Affect appropriate Chronically ill black female  HEENT: normal Neck supple with no adenopathy JVP normal no bruits no thyromegaly Lungs clear with no wheezing and good diaphragmatic motion Heart:  S1/S2 no murmur,  no rub, gallop or click PMI normal Abdomen: benighn, BS positve, no tenderness, no AAA no bruit.  No HSM or HJR Distal pulses intact with no bruits No edema Neuro non-focal Skin warm and dry No muscular weakness   Labs:   Lab Results  Component Value Date   WBC 5.3 05/09/2021   HGB 11.5 (L) 05/09/2021   HCT 35.9 (L) 05/09/2021   MCV 95.5 05/09/2021   PLT 180 05/09/2021   No results for input(s): "NA", "K", "CL", "CO2", "BUN", "CREATININE", "CALCIUM", "PROT", "BILITOT", "ALKPHOS", "ALT", "AST", "GLUCOSE" in the last 168 hours.  Invalid input(s): "LABALBU" Lab Results  Component Value Date   TROPONINI <0.30 11/16/2012    Lab Results  Component Value Date   CHOL 159 07/20/2018   Lab Results  Component Value Date   HDL 70 07/20/2018   Lab Results  Component Value Date   LDLCALC 76 07/20/2018   Lab Results  Component Value Date   TRIG 62 07/20/2018   No results found for: "CHOLHDL" No results found for: "LDLDIRECT"    Radiology: No results found.  EKG: 2019 SR rate 74 otherwise normal    ASSESSMENT AND PLAN:   1. PaF: continue eliquis and beta blocker in NSR today labs with Dr Judi Cong at assisted living  2. HTN:  Well controlled.  Continue current medications and low sodium Dash type diet.   3. HLD:  Continue statin labs with primary  4. Dementia:  Stable resides in assisted living ? Secondary to ETOH  F/U in a year   Signed: Jenkins Rouge 10/31/2022, 9:27 AM

## 2022-10-31 ENCOUNTER — Ambulatory Visit: Payer: Medicare (Managed Care) | Attending: Cardiovascular Disease | Admitting: Cardiovascular Disease

## 2022-10-31 ENCOUNTER — Encounter: Payer: Self-pay | Admitting: Cardiovascular Disease

## 2022-10-31 VITALS — BP 146/81 | HR 86 | Ht 66.0 in | Wt 160.0 lb

## 2022-10-31 DIAGNOSIS — I480 Paroxysmal atrial fibrillation: Secondary | ICD-10-CM | POA: Diagnosis not present

## 2022-10-31 DIAGNOSIS — Z7901 Long term (current) use of anticoagulants: Secondary | ICD-10-CM

## 2022-10-31 NOTE — Patient Instructions (Signed)
Medication Instructions:  Your physician recommends that you continue on your current medications as directed. Please refer to the Current Medication list given to you today.   Labwork: None  Testing/Procedures: None  Follow-Up: Follow up with Dr. Nishan in 1 year.   Any Other Special Instructions Will Be Listed Below (If Applicable).     If you need a refill on your cardiac medications before your next appointment, please call your pharmacy.
